# Patient Record
Sex: Female | Born: 1995 | Hispanic: Yes | Marital: Single | Smoking: Never smoker
Health system: Southern US, Community
[De-identification: ages and names within clinical notes are randomized; demographics above are authoritative.]

## PROBLEM LIST (undated history)

## (undated) DIAGNOSIS — Z8759 Personal history of other complications of pregnancy, childbirth and the puerperium: Secondary | ICD-10-CM

## (undated) DIAGNOSIS — K219 Gastro-esophageal reflux disease without esophagitis: Secondary | ICD-10-CM

## (undated) DIAGNOSIS — D649 Anemia, unspecified: Secondary | ICD-10-CM

## (undated) HISTORY — DX: Personal history of other complications of pregnancy, childbirth and the puerperium: Z87.59

## (undated) HISTORY — PX: NO PAST SURGERIES: SHX2092

---
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
## 2015-03-08 NOTE — H&P (Signed)
L&D Evaluation:  History:  HPI 19 year old G1 31P90 with EDC=08/26/2013 by 9 week ultrasound presents at 4039 5/7 weeks with c/o onset contractions at 0300 and worsening this AM. Was seen in office at 1030 and was 3/90% (a change from when seen earlier this AM in L&D). No LOF or VB. [facility], a neg tetra AFP, E coli UTI in early pregnancy treated with Amoxicillin, a normal anatomy scan, and anemia. LABS: O POS, Varicella NI, RI, GBS negative. Will be breast and bottle feeding.   Presents with contractions   Patient's Medical History No Chronic Illness   Patient's Surgical History none   Medications Pre Natal Vitamins  Iron   Allergies NKDA   Social History none   Family History Non-Contributory   ROS:  ROS see HPI   Exam:  Vital Signs stable  115/71   Urine Protein not completed   General no apparent distress   Mental Status clear   Chest clear   Heart normal sinus rhythm, no murmur/gallop/rubs   Abdomen gravid, tender with contractions   Estimated Fetal Weight Average for gestational age, 6#   Fetal Position cephalic/ROP on US   Edema no edema   Reflexes 1+   Pelvic no external lesions, 4-5cm/95%/0 on RN exam   Mebranes Intact   FHT normal rate with no decels, 135 baseline with accels to 150s-170, CAT 1   Fetal Heart Rate 135   Ucx regular, q4 min   Skin dry   Impression:  Impression IUP at 38 5/7 weeks in active labor   Plan:  Plan EFM/NST, monitor contractions and for cervical change, expectant management. Intermittent monitoring if desires to be up or in shower. Stadol if desires for pain.   Electronic Signatures: Trinna BalloonGutierrez, Murice Barbar L (CNM)  (Signed 20-Oct-14 16:22)  Authored: L&D Evaluation   Last Updated: 20-Oct-14 16:22 by Trinna BalloonGutierrez, Marnette Perkins L (CNM)

## 2015-10-09 ENCOUNTER — Encounter: Payer: Self-pay | Admitting: Emergency Medicine

## 2015-10-09 ENCOUNTER — Emergency Department: Payer: Medicaid Other

## 2015-10-09 ENCOUNTER — Emergency Department
Admission: EM | Admit: 2015-10-09 | Discharge: 2015-10-10 | Disposition: A | Discharge status: Home / Self Care | Payer: Medicaid Other | Attending: Emergency Medicine | Admitting: Emergency Medicine

## 2015-10-09 DIAGNOSIS — M549 Dorsalgia, unspecified: Secondary | ICD-10-CM | POA: Insufficient documentation

## 2015-10-09 DIAGNOSIS — R110 Nausea: Secondary | ICD-10-CM | POA: Diagnosis not present

## 2015-10-09 DIAGNOSIS — Z3202 Encounter for pregnancy test, result negative: Secondary | ICD-10-CM | POA: Diagnosis not present

## 2015-10-09 DIAGNOSIS — R1011 Right upper quadrant pain: Secondary | ICD-10-CM

## 2015-10-09 DIAGNOSIS — R1030 Lower abdominal pain, unspecified: Secondary | ICD-10-CM | POA: Diagnosis present

## 2015-10-09 LAB — URINALYSIS COMPLETE WITH MICROSCOPIC (ARMC ONLY)
BACTERIA UA: NONE SEEN
Bilirubin Urine: NEGATIVE
Glucose, UA: NEGATIVE mg/dL
KETONES UR: NEGATIVE mg/dL
Leukocytes, UA: NEGATIVE
NITRITE: NEGATIVE
PH: 6 (ref 5.0–8.0)
PROTEIN: NEGATIVE mg/dL
Specific Gravity, Urine: 1.025 (ref 1.005–1.030)

## 2015-10-09 LAB — CBC WITH DIFFERENTIAL/PLATELET
BASOS ABS: 0.1 10*3/uL (ref 0–0.1)
BASOS PCT: 1 %
Eosinophils Absolute: 0.1 10*3/uL (ref 0–0.7)
Eosinophils Relative: 1 %
HEMATOCRIT: 38.4 % (ref 35.0–47.0)
Hemoglobin: 12.5 g/dL (ref 12.0–16.0)
LYMPHS PCT: 30 %
Lymphs Abs: 3.1 10*3/uL (ref 1.0–3.6)
MCH: 26.6 pg (ref 26.0–34.0)
MCHC: 32.5 g/dL (ref 32.0–36.0)
MCV: 82 fL (ref 80.0–100.0)
Monocytes Absolute: 0.6 10*3/uL (ref 0.2–0.9)
Monocytes Relative: 6 %
NEUTROS ABS: 6.7 10*3/uL — AB (ref 1.4–6.5)
Neutrophils Relative %: 62 %
Platelets: 226 10*3/uL (ref 150–440)
RBC: 4.68 MIL/uL (ref 3.80–5.20)
RDW: 12.7 % (ref 11.5–14.5)
WBC: 10.6 10*3/uL (ref 3.6–11.0)

## 2015-10-09 LAB — COMPREHENSIVE METABOLIC PANEL
ALBUMIN: 4.1 g/dL (ref 3.5–5.0)
ALK PHOS: 120 U/L (ref 38–126)
ALT: 16 U/L (ref 14–54)
ANION GAP: 4 — AB (ref 5–15)
AST: 20 U/L (ref 15–41)
BILIRUBIN TOTAL: 0.2 mg/dL — AB (ref 0.3–1.2)
BUN: 11 mg/dL (ref 6–20)
CALCIUM: 9.1 mg/dL (ref 8.9–10.3)
CO2: 25 mmol/L (ref 22–32)
Chloride: 109 mmol/L (ref 101–111)
Creatinine, Ser: 0.62 mg/dL (ref 0.44–1.00)
Glucose, Bld: 105 mg/dL — ABNORMAL HIGH (ref 65–99)
POTASSIUM: 3.8 mmol/L (ref 3.5–5.1)
Sodium: 138 mmol/L (ref 135–145)
TOTAL PROTEIN: 7.3 g/dL (ref 6.5–8.1)

## 2015-10-09 LAB — PREGNANCY, URINE: PREG TEST UR: NEGATIVE

## 2015-10-09 LAB — LIPASE, BLOOD: LIPASE: 22 U/L (ref 11–51)

## 2015-10-09 NOTE — ED Notes (Addendum)
Pt c/o right lower abd pain and right mid back pain for 2 weeks; seen by her MD yesterday; not prescribed any medications; was told to come to the ED if symptoms worsened; pt says pain is worse; was told she had traces of blood in her urine yesterday; denies frequency or dysuria

## 2015-10-10 ENCOUNTER — Emergency Department: Payer: Medicaid Other

## 2015-10-10 ENCOUNTER — Other Ambulatory Visit: Payer: Self-pay | Admitting: Gastroenterology

## 2015-10-10 DIAGNOSIS — R160 Hepatomegaly, not elsewhere classified: Secondary | ICD-10-CM

## 2015-10-10 MED ORDER — IBUPROFEN 800 MG PO TABS
800.0000 mg | ORAL_TABLET | Freq: Once | ORAL | Status: AC
  Administered 2015-10-10: 800 mg via ORAL
  Filled 2015-10-10: qty 1

## 2015-10-10 NOTE — Discharge Instructions (Signed)

## 2015-10-10 NOTE — ED Provider Notes (Signed)
[facility] Emergency Department Provider Note  ____________________________________________  Time seen: Approximately 2306 PM  I have reviewed the triage vital signs and the nursing notes.   HISTORY  Chief Complaint Back Pain and Abdominal Pain    HPI Rebecca PlattJulisa Woods is a 19 y.o. female who comes into the hospital today with lower abdominal pain. She reports that she's had this pain for 2 weeks. She saw her doctor on Friday and reports that she was told she had a had a kidney infection, something wrong with her gallbladder or something wrong with her appendix. She reports that her doctor told her she had some blood in her urine test. She reports that her doctor did not start her on any medication for her urine infection but did order an ultrasound which is scheduled on the 28th of this month. She reports though that the pain has gotten worse. She reports that it has been bothering her so she decided to come in to be evaluated. The patient denies any fever or vomiting but has had some nausea. Her appetite has been normal and she denies pain with urination. The patient has an IUD so she has not had any menstrual periods and she is sexually active. The patient has never had this pain before and reports her pain as a 7 out of 10 in intensity. She is not taking anything for pain at home. She was given pills by her doctor to help her have a bowel movement but she reports that she doesn't think that is her problem so she did not take any of them. The patient reports that her pain is worse with eating. She is here for evaluation.   History reviewed. No pertinent past medical history.  There are no active problems to display for this patient.   History reviewed. No pertinent past surgical history.  No current outpatient prescriptions on file.  Allergies Review of patient's allergies indicates no known allergies.  History reviewed. No pertinent family history.  Social  History Social History  Substance Use Topics  . Smoking status: Never Smoker   . Smokeless tobacco: None  . Alcohol Use: No    Review of Systems Constitutional: No fever/chills Eyes: No visual changes. ENT: No sore throat. Cardiovascular: Denies chest pain. Respiratory: Denies shortness of breath. Gastrointestinal: abdominal pain.  And nausea, no vomiting.  No diarrhea.  No constipation. Genitourinary: Negative for dysuria. Musculoskeletal: Negative for back pain. Skin: Negative for rash. Neurological: Negative for headaches, focal weakness or numbness.  10-point ROS otherwise negative.  ____________________________________________   PHYSICAL EXAM:  VITAL SIGNS: ED Triage Vitals  Enc Vitals Group     BP 10/09/15 2150 113/79 mmHg     Pulse Rate 10/09/15 2150 87     Resp 10/09/15 2150 18     Temp 10/09/15 2150 98.5 F (36.9 C)     Temp Source 10/09/15 2150 Oral     SpO2 10/09/15 2150 100 %     Weight 10/09/15 2150 161 lb (73.029 kg)     Height 10/09/15 2150 5\' 7"  (1.702 m)     Head Cir --      Peak Flow --      Pain Score 10/09/15 2151 7     Pain Loc --      Pain Edu? --      Excl. in GC? --     Constitutional: Alert and oriented. Well appearing and in mild distress. Eyes: Conjunctivae are normal. PERRL. EOMI. Head: Atraumatic. Nose:  No congestion/rhinnorhea. Mouth/Throat: Mucous membranes are moist.  Oropharynx non-erythematous. Cardiovascular: Normal rate, regular rhythm. Grossly normal heart sounds.  Good peripheral circulation. Respiratory: Normal respiratory effort.  No retractions. Lungs CTAB. Gastrointestinal: Soft with right upper quadrant tenderness to palpation. No distention. Positive bowel sounds Musculoskeletal: No lower extremity tenderness nor edema.   Neurologic:  Normal speech and language.  Skin:  Skin is warm, dry and intact.  Psychiatric: Mood and affect are normal.   ____________________________________________   LABS (all labs ordered  are listed, but only abnormal results are displayed)  Labs Reviewed  CBC WITH DIFFERENTIAL/PLATELET - Abnormal; Notable for the following:    Neutro Abs 6.7 (*)    All other components within normal limits  COMPREHENSIVE METABOLIC PANEL - Abnormal; Notable for the following:    Glucose, Bld 105 (*)    Total Bilirubin 0.2 (*)    Anion gap 4 (*)    All other components within normal limits  URINALYSIS COMPLETEWITH MICROSCOPIC ([facility] ONLY) - Abnormal; Notable for the following:    Color, Urine YELLOW (*)    APPearance HAZY (*)    Hgb urine dipstick 1+ (*)    Squamous Epithelial / LPF 6-30 (*)    All other components within normal limits  LIPASE, BLOOD  PREGNANCY, URINE   ____________________________________________  EKG  None ____________________________________________  RADIOLOGY  KUB: Unremarkable bowel gas pattern, no free intra-abdominal air seen [place] abd: Unremarkable ultrasound of the right upper quadrant ____________________________________________   PROCEDURES  Procedure(s) performed: None  Critical Care performed: No  ____________________________________________   INITIAL IMPRESSION / ASSESSMENT AND PLAN / ED COURSE  Pertinent labs & imaging results that were available during my care of the patient were reviewed by me and considered in my medical decision making (see chart for details).  This is a 19 year old female who comes into the hospital today with abdominal pain that she's had for 2 weeks. Although the patient saw her physician and has an ultrasound scheduled she reports that she is unable to tolerate the pain so she came in for evaluation.  The patient did receive a dose of Profen and she reports that she feels improved. The patient's blood counts are unremarkable she does not have an elevated white blood cell count nor does she have any infection present in her urine. I will have the patient follow back up with her primary care physician for further  evaluation of this two-week long pain. I informed the patient that she can try ibuprofen and Tylenol for the symptoms as well. The patient expresses agreement with the plan will be discharged to follow-up with her primary care physician. ____________________________________________   FINAL CLINICAL IMPRESSION(S) / ED DIAGNOSES  Final diagnoses:  RUQ abdominal pain      Rebecka Apley, MD 10/10/15 [phone]

## 2015-10-30 HISTORY — PX: OVARIAN CYST SURGERY: SHX726

## 2016-05-17 ENCOUNTER — Other Ambulatory Visit: Payer: Medicaid Other

## 2016-05-17 ENCOUNTER — Encounter: Payer: Self-pay | Admitting: *Deleted

## 2016-05-17 NOTE — Patient Instructions (Signed)
  Your procedure is scheduled on: 05-24-16 (THURSDAY) Report to Same Day Surgery 2nd floor medical mall To find out your arrival time please call [phone] between 1PM - 3PM on 05-23-16 (WEDNESDAY)  Remember: Instructions that are not followed completely may result in serious medical risk, up to and including death, or upon the discretion of your surgeon and anesthesiologist your surgery may need to be rescheduled.    _x___ 1. Do not eat food or drink liquids after midnight. No gum chewing or hard candies.     __x__ 2. No Alcohol for 24 hours before or after surgery.   __x__3. No Smoking for 24 prior to surgery.   ____  4. Bring all medications with you on the day of surgery if instructed.    __x__ 5. Notify your doctor if there is any change in your medical condition     (cold, fever, infections).     Do not wear jewelry, make-up, hairpins, clips or nail polish.  Do not wear lotions, powders, or perfumes. You may wear deodorant.  Do not shave 48 hours prior to surgery. Men may shave face and neck.  Do not bring valuables to the hospital.    [facility] is not responsible for any belongings or valuables.               Contacts, dentures or bridgework may not be worn into surgery.  Leave your suitcase in the car. After surgery it may be brought to your room.  For patients admitted to the hospital, discharge time is determined by your treatment team.   Patients discharged the day of surgery will not be allowed to drive home.    Please read over the following fact sheets that you were given:   [facility] Preparing for Surgery and or MRSA Information   ____ Take these medicines the morning of surgery with A SIP OF WATER:    1. NONE  2.  3.  4.  5.  6.  ____ Fleet Enema (as directed)   _x___ Use CHG Soap or sage wipes as directed on instruction sheet   ____ Use inhalers on the day of surgery and bring to hospital day of surgery  ____ Stop metformin 2 days prior to  surgery    ____ Take 1/2 of usual insulin dose the night before surgery and none on the morning of surgery.   ____ Stop aspirin or coumadin, or plavix  _x__ Stop Anti-inflammatories such as Advil, Aleve, Ibuprofen, Motrin, Naproxen,          Naprosyn, Goodies powders or aspirin products. Ok to take Tylenol.   ____ Stop supplements until after surgery.    ____ Bring C-Pap to the hospital.  

## 2016-05-18 ENCOUNTER — Encounter
Admission: RE | Admit: 2016-05-18 | Discharge: 2016-05-18 | Disposition: A | Discharge status: Home / Self Care | Payer: Medicaid Other | Source: Ambulatory Visit | Attending: Obstetrics & Gynecology | Admitting: Obstetrics & Gynecology

## 2016-05-18 DIAGNOSIS — Z01812 Encounter for preprocedural laboratory examination: Secondary | ICD-10-CM | POA: Diagnosis present

## 2016-05-18 LAB — CBC
HCT: 37.9 % (ref 35.0–47.0)
HEMOGLOBIN: 12.9 g/dL (ref 12.0–16.0)
MCH: 28.1 pg (ref 26.0–34.0)
MCHC: 34 g/dL (ref 32.0–36.0)
MCV: 82.6 fL (ref 80.0–100.0)
Platelets: 202 10*3/uL (ref 150–440)
RBC: 4.59 MIL/uL (ref 3.80–5.20)
RDW: 13 % (ref 11.5–14.5)
WBC: 5.3 10*3/uL (ref 3.6–11.0)

## 2016-05-18 LAB — TYPE AND SCREEN
ABO/RH(D): O POS
Antibody Screen: NEGATIVE

## 2016-05-24 ENCOUNTER — Ambulatory Visit
Admission: RE | Admit: 2016-05-24 | Discharge: 2016-05-24 | Disposition: A | Discharge status: Home / Self Care | Payer: Medicaid Other | Source: Ambulatory Visit | Attending: Obstetrics & Gynecology | Admitting: Obstetrics & Gynecology

## 2016-05-24 ENCOUNTER — Encounter: Payer: Self-pay | Admitting: *Deleted

## 2016-05-24 ENCOUNTER — Encounter
Admission: RE | Disposition: A | Discharge status: Home / Self Care | Payer: Self-pay | Source: Ambulatory Visit | Attending: Obstetrics & Gynecology

## 2016-05-24 ENCOUNTER — Ambulatory Visit: Payer: Medicaid Other | Admitting: Anesthesiology

## 2016-05-24 DIAGNOSIS — N9419 Other specified dyspareunia: Secondary | ICD-10-CM

## 2016-05-24 DIAGNOSIS — G8929 Other chronic pain: Secondary | ICD-10-CM | POA: Insufficient documentation

## 2016-05-24 DIAGNOSIS — R102 Pelvic and perineal pain: Secondary | ICD-10-CM | POA: Insufficient documentation

## 2016-05-24 DIAGNOSIS — N83202 Unspecified ovarian cyst, left side: Secondary | ICD-10-CM | POA: Diagnosis present

## 2016-05-24 DIAGNOSIS — N941 Unspecified dyspareunia: Secondary | ICD-10-CM | POA: Diagnosis present

## 2016-05-24 DIAGNOSIS — Z800 Family history of malignant neoplasm of digestive organs: Secondary | ICD-10-CM | POA: Diagnosis not present

## 2016-05-24 DIAGNOSIS — Z975 Presence of (intrauterine) contraceptive device: Secondary | ICD-10-CM | POA: Diagnosis not present

## 2016-05-24 HISTORY — DX: Other specified dyspareunia: N94.19

## 2016-05-24 HISTORY — DX: Anemia, unspecified: D64.9

## 2016-05-24 HISTORY — PX: LAPAROSCOPY: SHX197

## 2016-05-24 HISTORY — DX: Gastro-esophageal reflux disease without esophagitis: K21.9

## 2016-05-24 LAB — URINE DRUG SCREEN, QUALITATIVE (ARMC ONLY)
AMPHETAMINES, UR SCREEN: NOT DETECTED
BARBITURATES, UR SCREEN: NOT DETECTED
BENZODIAZEPINE, UR SCRN: NOT DETECTED
CANNABINOID 50 NG, UR ~~LOC~~: POSITIVE — AB
COCAINE METABOLITE, UR ~~LOC~~: NOT DETECTED
MDMA (Ecstasy)Ur Screen: NOT DETECTED
METHADONE SCREEN, URINE: NOT DETECTED
Opiate, Ur Screen: NOT DETECTED
Phencyclidine (PCP) Ur S: NOT DETECTED
Tricyclic, Ur Screen: NOT DETECTED

## 2016-05-24 LAB — POCT PREGNANCY, URINE: PREG TEST UR: NEGATIVE

## 2016-05-24 SURGERY — LAPAROSCOPY, DIAGNOSTIC
Anesthesia: General | Site: Abdomen | Wound class: Clean Contaminated

## 2016-05-24 MED ORDER — ROCURONIUM BROMIDE 100 MG/10ML IV SOLN
INTRAVENOUS | Status: DC | PRN
  Administered 2016-05-24: 40 mg via INTRAVENOUS

## 2016-05-24 MED ORDER — PROPOFOL 10 MG/ML IV BOLUS
INTRAVENOUS | Status: DC | PRN
  Administered 2016-05-24: 150 mg via INTRAVENOUS

## 2016-05-24 MED ORDER — FENTANYL CITRATE (PF) 100 MCG/2ML IJ SOLN
25.0000 ug | INTRAMUSCULAR | Status: DC | PRN
  Administered 2016-05-24 (×2): 50 ug via INTRAVENOUS

## 2016-05-24 MED ORDER — OXYCODONE HCL 5 MG PO TABS
5.0000 mg | ORAL_TABLET | Freq: Once | ORAL | Status: AC | PRN
  Administered 2016-05-24: 5 mg via ORAL

## 2016-05-24 MED ORDER — OXYCODONE-ACETAMINOPHEN 5-325 MG PO TABS: 1.0000 | ORAL_TABLET | ORAL | 0 refills | Status: DC | PRN

## 2016-05-24 MED ORDER — OXYCODONE HCL 5 MG/5ML PO SOLN: 5.0000 mg | Freq: Once | ORAL | Status: AC | PRN

## 2016-05-24 MED ORDER — OXYCODONE HCL 5 MG PO TABS
ORAL_TABLET | ORAL | Status: AC
  Filled 2016-05-24: qty 1

## 2016-05-24 MED ORDER — FENTANYL CITRATE (PF) 100 MCG/2ML IJ SOLN
INTRAMUSCULAR | Status: DC | PRN
  Administered 2016-05-24 (×2): 50 ug via INTRAVENOUS
  Administered 2016-05-24: 100 ug via INTRAVENOUS

## 2016-05-24 MED ORDER — DEXAMETHASONE SODIUM PHOSPHATE 10 MG/ML IJ SOLN
INTRAMUSCULAR | Status: DC | PRN
  Administered 2016-05-24: 10 mg via INTRAVENOUS

## 2016-05-24 MED ORDER — LACTATED RINGERS IV SOLN
INTRAVENOUS | Status: DC
  Administered 2016-05-24 (×2): via INTRAVENOUS

## 2016-05-24 MED ORDER — BUPIVACAINE HCL (PF) 0.5 % IJ SOLN
INTRAMUSCULAR | Status: DC | PRN
  Administered 2016-05-24: 12 mL

## 2016-05-24 MED ORDER — FAMOTIDINE 20 MG PO TABS
20.0000 mg | ORAL_TABLET | Freq: Once | ORAL | Status: AC
  Administered 2016-05-24: 20 mg via ORAL

## 2016-05-24 MED ORDER — GLYCOPYRROLATE 0.2 MG/ML IJ SOLN
INTRAMUSCULAR | Status: DC | PRN
  Administered 2016-05-24: 0.6 mg via INTRAVENOUS

## 2016-05-24 MED ORDER — FENTANYL CITRATE (PF) 100 MCG/2ML IJ SOLN
INTRAMUSCULAR | Status: AC
  Administered 2016-05-24: 50 ug via INTRAVENOUS
  Filled 2016-05-24: qty 2

## 2016-05-24 MED ORDER — LIDOCAINE HCL (CARDIAC) 20 MG/ML IV SOLN
INTRAVENOUS | Status: DC | PRN
  Administered 2016-05-24: 30 mg via INTRAVENOUS

## 2016-05-24 MED ORDER — FAMOTIDINE 20 MG PO TABS
ORAL_TABLET | ORAL | Status: AC
  Administered 2016-05-24: 20 mg via ORAL
  Filled 2016-05-24: qty 1

## 2016-05-24 MED ORDER — MIDAZOLAM HCL 2 MG/2ML IJ SOLN
INTRAMUSCULAR | Status: DC | PRN
  Administered 2016-05-24: 2 mg via INTRAVENOUS

## 2016-05-24 MED ORDER — ONDANSETRON HCL 4 MG/2ML IJ SOLN
INTRAMUSCULAR | Status: DC | PRN
  Administered 2016-05-24: 4 mg via INTRAVENOUS

## 2016-05-24 MED ORDER — BUPIVACAINE HCL (PF) 0.5 % IJ SOLN
INTRAMUSCULAR | Status: AC
  Filled 2016-05-24: qty 30

## 2016-05-24 MED ORDER — NEOSTIGMINE METHYLSULFATE 10 MG/10ML IV SOLN
INTRAVENOUS | Status: DC | PRN
  Administered 2016-05-24: 4 mg via INTRAVENOUS

## 2016-05-24 SURGICAL SUPPLY — 35 items
BLADE SURG SZ11 CARB STEEL (BLADE) ×5 IMPLANT
CANISTER SUCT 1200ML W/VALVE (MISCELLANEOUS) ×5 IMPLANT
CATH ROBINSON RED A/P 16FR (CATHETERS) ×5 IMPLANT
CHLORAPREP W/TINT 26ML (MISCELLANEOUS) ×5 IMPLANT
DRESSING TELFA 4X3 1S ST N-ADH (GAUZE/BANDAGES/DRESSINGS) IMPLANT
ENDOPOUCH RETRIEVER 10 (MISCELLANEOUS) IMPLANT
GAUZE SPONGE NON-WVN 2X2 STRL (MISCELLANEOUS) IMPLANT
GLOVE BIO SURGEON STRL SZ8 (GLOVE) ×10 IMPLANT
GLOVE INDICATOR 8.0 STRL GRN (GLOVE) ×5 IMPLANT
GOWN STRL REUS W/ TWL LRG LVL3 (GOWN DISPOSABLE) ×6 IMPLANT
GOWN STRL REUS W/ TWL XL LVL3 (GOWN DISPOSABLE) ×3 IMPLANT
GOWN STRL REUS W/TWL LRG LVL3 (GOWN DISPOSABLE) ×4
GOWN STRL REUS W/TWL XL LVL3 (GOWN DISPOSABLE) ×2
IRRIGATION STRYKERFLOW (MISCELLANEOUS) IMPLANT
IRRIGATOR STRYKERFLOW (MISCELLANEOUS)
IV LACTATED RINGERS 1000ML (IV SOLUTION) IMPLANT
KIT PINK PAD W/HEAD ARE REST (MISCELLANEOUS) ×5
KIT PINK PAD W/HEAD ARM REST (MISCELLANEOUS) ×3 IMPLANT
LABEL OR SOLS (LABEL) ×5 IMPLANT
LIQUID BAND (GAUZE/BANDAGES/DRESSINGS) ×5 IMPLANT
NEEDLE VERESS 14GA 120MM (NEEDLE) ×5 IMPLANT
NS IRRIG 500ML POUR BTL (IV SOLUTION) ×5 IMPLANT
PACK GYN LAPAROSCOPIC (MISCELLANEOUS) ×5 IMPLANT
PAD PREP 24X41 OB/GYN DISP (PERSONAL CARE ITEMS) ×5 IMPLANT
SCISSORS METZENBAUM CVD 33 (INSTRUMENTS) ×5 IMPLANT
SHEARS HARMONIC ACE PLUS 36CM (ENDOMECHANICALS) IMPLANT
SLEEVE ENDOPATH XCEL 5M (ENDOMECHANICALS) IMPLANT
SPONGE VERSALON 2X2 STRL (MISCELLANEOUS)
STRAP SAFETY BODY (MISCELLANEOUS) ×5 IMPLANT
SUT VIC AB 2-0 UR6 27 (SUTURE) IMPLANT
SUT VIC AB 4-0 PS2 18 (SUTURE) IMPLANT
SYRINGE 10CC LL (SYRINGE) ×5 IMPLANT
TROCAR ENDO BLADELESS 11MM (ENDOMECHANICALS) IMPLANT
TROCAR XCEL NON-BLD 5MMX100MML (ENDOMECHANICALS) ×5 IMPLANT
TUBING INSUFFLATOR HI FLOW (MISCELLANEOUS) ×5 IMPLANT

## 2016-05-24 NOTE — Anesthesia Preprocedure Evaluation (Signed)
Anesthesia Evaluation  Patient identified by MRN, date of birth, ID band Patient awake    Reviewed: Allergy & Precautions, NPO status , Patient's Chart, lab work & pertinent test results  History of Anesthesia Complications Negative for: history of anesthetic complications  Airway Mallampati: I  TM Distance: >3 FB Neck ROM: Full    Dental no notable dental hx.    Pulmonary neg COPD,    breath sounds clear to auscultation- rhonchi (-) wheezing      Cardiovascular Exercise Tolerance: Good (-) hypertension(-) CAD  Rhythm:Regular Rate:Normal - Systolic murmurs and - Diastolic murmurs    Neuro/Psych negative neurological ROS  negative psych ROS   GI/Hepatic Neg liver ROS, GERD  ,  Endo/Other  negative endocrine ROSneg diabetes  Renal/GU negative Renal ROS     Musculoskeletal negative musculoskeletal ROS (+)   Abdominal (+) - obese,   Peds  Hematology  (+) anemia ,   Anesthesia Other Findings   Reproductive/Obstetrics                             Anesthesia Physical Anesthesia Plan  ASA: I  Anesthesia Plan: General   Post-op Pain Management:    Induction: Intravenous  Airway Management Planned: Oral ETT  Additional Equipment:   Intra-op Plan:   Post-operative Plan: Extubation in OR  Informed Consent: I have reviewed the patients History and Physical, chart, labs and discussed the procedure including the risks, benefits and alternatives for the proposed anesthesia with the patient or authorized representative who has indicated his/her understanding and acceptance.   Dental advisory given  Plan Discussed with: Anesthesiologist and CRNA  Anesthesia Plan Comments:         Anesthesia Quick Evaluation

## 2016-05-24 NOTE — H&P (Signed)
History and Physical Interval Note:  05/24/2016 2:56 PM  Rebecca Woods  has presented today for surgery, with the diagnosis of chronic pelvic pain,ovarian cyst-left  The various methods of treatment have been discussed with the patient and family. After consideration of risks, benefits and other options for treatment, the patient has consented to  Procedure(s): LAPAROSCOPY OPERATIVE/ EXCISION OF ENDOMETRIOSIS (N/A) LAPAROSCOPIC OVARIAN CYSTECTOMY (Left) as a surgical intervention .  The patient's history has been reviewed, patient examined, no change in status, stable for surgery.  Pt has the following beta blocker history-  Not taking Beta Blocker.  I have reviewed the patient's chart and labs.  Questions were answered to the patient's satisfaction.       Letitia Libra

## 2016-05-24 NOTE — Transfer of Care (Signed)
Immediate Anesthesia Transfer of Care Note  Patient: Rebecca Woods  Procedure(s) Performed: Procedure(s): LAPAROSCOPY DIAGNOSTIC (N/A)  Patient Location: PACU  Anesthesia Type:General  Level of Consciousness: patient cooperative and lethargic  Airway & Oxygen Therapy: Patient Spontanous Breathing and Patient connected to face mask oxygen  Post-op Assessment: Report given to RN and Post -op Vital signs reviewed and stable  Post vital signs: Reviewed and stable  Last Vitals:  Vitals:   05/24/16 1241 05/24/16 1617  BP: 111/66 120/71  Pulse: 83   Resp: 16 20  Temp: 36.8 C 36.2 C    Last Pain:  Vitals:   05/24/16 1617  TempSrc: Tympanic         Complications: No apparent anesthesia complications

## 2016-05-24 NOTE — Discharge Instructions (Addendum)
AMBULATORY SURGERY  °DISCHARGE INSTRUCTIONS ° ° °1) The drugs that you were given will stay in your system until tomorrow so for the next 24 hours you should not: ° °A) Drive an automobile °B) Make any legal decisions °C) Drink any alcoholic beverage ° ° °2) You may resume regular meals tomorrow.  Today it is better to start with liquids and gradually work up to solid foods. ° °You may eat anything you prefer, but it is better to start with liquids, then soup and crackers, and gradually work up to solid foods. ° ° °3) Please notify your doctor immediately if you have any unusual bleeding, trouble breathing, redness and pain at the surgery site, drainage, fever, or pain not relieved by medication. ° ° ° °4) Additional Instructions: ° ° ° ° ° ° ° °Please contact your physician with any problems or Same Day Surgery at [phone], Monday through Friday 6 am to 4 pm, or [place] at  Main number at [phone].General Gynecological Post-Operative Instructions °You may expect to feel dizzy, weak, and drowsy for as long as 24 hours after receiving the medicine that made you sleep (anesthetic).  °Do not drive a car, ride a bicycle, participate in physical activities, or take public transportation until you are done taking narcotic pain medicines or as directed by your doctor.  °Do not drink alcohol or take tranquilizers.  °Do not take medicine that has not been prescribed by your doctor.  °Do not sign important papers or make important decisions while on narcotic pain medicines.  °Have a responsible person with you.  °CARE OF INCISION  °Keep incision clean and dry. °Take showers instead of baths until your doctor gives you permission to take baths.  °Avoid heavy lifting (more than 10 pounds/4.5 kilograms), pushing, or pulling.  °Avoid activities that may risk injury to your surgical site.  °No sexual intercourse or placement of anything in the vagina for 1 weeks or as instructed by your doctor. °If you have  tubes coming from the wound site, check with your doctor regarding appropriate care of the tubes. °Only take prescription or over-the-counter medicines  for pain, discomfort, or fever as directed by your doctor. Do not take aspirin. It can make you bleed. Take medicines (antibiotics) that kill germs if they are prescribed for you.  °Call the office or go to the ER if:  °You feel sick to your stomach (nauseous) and you start to throw up (vomit).  °You have trouble eating or drinking.  °You have an oral temperature above 101.  °You have constipation that is not helped by adjusting diet or increasing fluid intake. Pain medicines are a common cause of constipation.  °You have any other concerns. °SEEK IMMEDIATE MEDICAL CARE IF:  °You have persistent dizziness.  °You have difficulty breathing or a congested sounding (croupy) cough.  °You have an oral temperature above 102.5, not controlled by medicine.  °There is increasing pain or tenderness near or in the surgical site.  ° ° ° °

## 2016-05-24 NOTE — Anesthesia Postprocedure Evaluation (Signed)
Anesthesia Post Note  Patient: Sayaka Trew  Procedure(s) Performed: Procedure(s) (LRB): LAPAROSCOPY DIAGNOSTIC (N/A)  Patient location during evaluation: PACU Anesthesia Type: General Level of consciousness: awake and alert Pain management: pain level controlled Vital Signs Assessment: post-procedure vital signs reviewed and stable Respiratory status: spontaneous breathing, nonlabored ventilation, respiratory function stable and patient connected to nasal cannula oxygen Cardiovascular status: blood pressure returned to baseline and stable Postop Assessment: no signs of nausea or vomiting Anesthetic complications: no    Last Vitals:  Vitals:   05/24/16 1656 05/24/16 1708  BP: 104/65 126/73  Pulse: 82 (!) 117  Resp: 16 16  Temp: 36.7 C (!) 35.9 C    Last Pain:  Vitals:   05/24/16 1708  TempSrc: Tympanic  PainSc: 4                  Jomarie Longs K Piscitello

## 2016-05-24 NOTE — Anesthesia Procedure Notes (Signed)
Procedure Name: Intubation Date/Time: 05/24/2016 3:26 PM Performed by: Omer Jack Pre-anesthesia Checklist: Patient identified, Patient being monitored, Timeout performed, Emergency Drugs available and Suction available Patient Re-evaluated:Patient Re-evaluated prior to inductionOxygen Delivery Method: Circle system utilized Preoxygenation: Pre-oxygenation with 100% oxygen Intubation Type: IV induction and Rapid sequence Ventilation: Mask ventilation without difficulty Laryngoscope Size: Miller and 2 Grade View: Grade I Tube type: Oral Tube size: 7.0 mm Number of attempts: 1 Airway Equipment and Method: Stylet Placement Confirmation: ETT inserted through vocal cords under direct vision,  positive ETCO2 and breath sounds checked- equal and bilateral Secured at: 21 cm Tube secured with: Tape Dental Injury: Teeth and Oropharynx as per pre-operative assessment

## 2016-05-24 NOTE — Op Note (Signed)
  Operative Note   05/24/2016  PRE-OP DIAGNOSIS: Pelvic pain and Dyspareunia    POST-OP DIAGNOSIS: same   PROCEDURE: Procedure(s): LAPAROSCOPY DIAGNOSTIC   SURGEON: Annamarie Major, MD, FACOG  ANESTHESIA: * No anesthesia type entered *   ESTIMATED BLOOD LOSS: Min  COMPLICATIONS: None  DISPOSITION: PACU - hemodynamically stable.  CONDITION: stable  FINDINGS: Laparoscopic survey of the abdomen revealed a grossly normal uterus, tubes, ovaries, liver edge, gallbladder edge and appendix, No intra-abdominal adhesions were noted.   PROCEDURE IN DETAIL: The patient was taken to the OR where anesthesia was administed. The patient was positioned in dorsal lithotomy in the [place] stirrups. The patient was then examined under anesthesia with the above noted findings. The patient was prepped and draped in the normal sterile fashion and foley catheter was placed. A Graves speculum was placed in the vagina and a sponge stick was placed for manipulation purposes.  IUD visualized (strings). The speculum was then removed.  Attention was turned to the patient's abdomen where a 5 mm skin incision was made in the umbilical fold, after injection of local anesthesia. The Veress step needle was carefully introduced into the peritoneal cavity with placement confirmed using the hanging drop technique.  Pneumoperitoneum was obtained. The 5 mm port was then placed under direct visualization with the operative laparoscope.  Trendelenburg positioning.  Additional 5 mm trocar was then placed in the RLQ lateral to the inferior epigastric blood vessels under direct visualization with the laparoscope.  Instrumentation to visualize complete pelvic anatomy performed.    No evidence for ovarian cysts or endometriosis or pelvic congestion or adhesions or PID noted.  Instruments and trocars removed, gas expelled, and skin closed with skin adhesive glue.  Instrument, needle, and sponge counts correct x2 at the conclusion of the  case.  Pt goes to recovery room in stable condition.

## 2016-05-24 NOTE — Progress Notes (Signed)
Pt states feeling somewhat better after pain pill

## 2016-05-25 ENCOUNTER — Encounter: Payer: Self-pay | Admitting: Obstetrics & Gynecology

## 2017-04-16 ENCOUNTER — Emergency Department: Payer: Medicaid Other

## 2017-04-16 ENCOUNTER — Emergency Department
Admission: EM | Admit: 2017-04-16 | Discharge: 2017-04-16 | Disposition: A | Discharge status: Home / Self Care | Payer: Medicaid Other | Attending: Emergency Medicine | Admitting: Emergency Medicine

## 2017-04-16 ENCOUNTER — Encounter: Payer: Self-pay | Admitting: Emergency Medicine

## 2017-04-16 DIAGNOSIS — R55 Syncope and collapse: Secondary | ICD-10-CM

## 2017-04-16 DIAGNOSIS — S098XXA Other specified injuries of head, initial encounter: Secondary | ICD-10-CM | POA: Diagnosis present

## 2017-04-16 DIAGNOSIS — S060X9A Concussion with loss of consciousness of unspecified duration, initial encounter: Secondary | ICD-10-CM

## 2017-04-16 DIAGNOSIS — W1839XA Other fall on same level, initial encounter: Secondary | ICD-10-CM | POA: Diagnosis not present

## 2017-04-16 DIAGNOSIS — Y999 Unspecified external cause status: Secondary | ICD-10-CM | POA: Diagnosis not present

## 2017-04-16 DIAGNOSIS — Y939 Activity, unspecified: Secondary | ICD-10-CM | POA: Diagnosis not present

## 2017-04-16 DIAGNOSIS — Y929 Unspecified place or not applicable: Secondary | ICD-10-CM | POA: Diagnosis not present

## 2017-04-16 LAB — CBC
HEMATOCRIT: 38.6 % (ref 35.0–47.0)
HEMOGLOBIN: 12.7 g/dL (ref 12.0–16.0)
MCH: 27.2 pg (ref 26.0–34.0)
MCHC: 32.9 g/dL (ref 32.0–36.0)
MCV: 82.5 fL (ref 80.0–100.0)
Platelets: 305 10*3/uL (ref 150–440)
RBC: 4.69 MIL/uL (ref 3.80–5.20)
RDW: 12.8 % (ref 11.5–14.5)
WBC: 12.9 10*3/uL — ABNORMAL HIGH (ref 3.6–11.0)

## 2017-04-16 LAB — BASIC METABOLIC PANEL
Anion gap: 6 (ref 5–15)
BUN: 9 mg/dL (ref 6–20)
CALCIUM: 8.9 mg/dL (ref 8.9–10.3)
CHLORIDE: 104 mmol/L (ref 101–111)
CO2: 27 mmol/L (ref 22–32)
CREATININE: 0.68 mg/dL (ref 0.44–1.00)
GFR calc Af Amer: >60 mL/min (ref >60)
GFR calc non Af Amer: >60 mL/min (ref >60)
Glucose, Bld: 80 mg/dL (ref 65–99)
Potassium: 3.9 mmol/L (ref 3.5–5.1)
SODIUM: 137 mmol/L (ref 135–145)

## 2017-04-16 LAB — POCT PREGNANCY, URINE: Preg Test, Ur: NEGATIVE

## 2017-04-16 NOTE — ED Triage Notes (Signed)
Patient states that she passed out and hit her head on Saturday. Patient states that she has had a headache since then. Patient denies taking any anticoagulants.

## 2017-04-16 NOTE — ED Provider Notes (Signed)
[facility] Emergency Department Provider Note  Time seen: 8:45 PM  I have reviewed the triage vital signs and the nursing notes.   HISTORY  Chief Complaint Headache and Head Injury    HPI Aiyah Scarpelli is a 21 y.o. female with a past medical history of anemia, presents to the emergency department for continued headache. According to the patient 3 days ago she had a syncopal event causing her to fall to the ground hitting her head. She states since that fall she has had a continued headache, somewhat worse today she also has a feeling of generalized fatigue/weakness. Denies any focal deficits. Large C negative review of systems otherwise. Describes her headache as moderate dull and global.  Past Medical History:  Diagnosis Date  . Anemia    H/O  . GERD (gastroesophageal reflux disease)    NO MEDS    Patient Active Problem List   Diagnosis Date Noted  . Dyspareunia due to medical condition in female 05/24/2016    Past Surgical History:  Procedure Laterality Date  . LAPAROSCOPY N/A 05/24/2016   Procedure: LAPAROSCOPY DIAGNOSTIC;  Surgeon: Nadara Mustard, MD;  Location: [facility] ORS;  Service: Gynecology;  Laterality: N/A;  . NO PAST SURGERIES      Prior to Admission medications   Medication Sig Start Date End Date Taking? Authorizing Provider  levonorgestrel (MIRENA) 20 MCG/24HR IUD 1 each by Intrauterine route once.    [provider]  oxyCODONE-acetaminophen (PERCOCET) 5-325 MG tablet Take 1 tablet by mouth every 4 (four) hours as needed for moderate pain or severe pain. 05/24/16   Nadara Mustard, MD    No Known Allergies  No family history on file.  Social History Social History  Substance Use Topics  . Smoking status: Never Smoker  . Smokeless tobacco: Former Neurosurgeon  . Alcohol use Yes    Review of Systems Constitutional: Negative for fever Cardiovascular: Negative for chest pain. Respiratory: Negative for shortness of  breath. Gastrointestinal: Negative for abdominal pain Musculoskeletal: Negative for back pain. Neurological: Moderate headache. Denies focal weakness or numbness All other ROS negative  ____________________________________________   PHYSICAL EXAM:  VITAL SIGNS: ED Triage Vitals  Enc Vitals Group     BP 04/16/17 1937 111/71     Pulse Rate 04/16/17 1937 97     Resp 04/16/17 1937 16     Temp 04/16/17 1937 98.7 F (37.1 C)     Temp Source 04/16/17 1937 Oral     SpO2 04/16/17 1937 100 %     Weight 04/16/17 1938 150 lb (68 kg)     Height 04/16/17 1938 5\' 6"  (1.676 m)     Head Circumference --      Peak Flow --      Pain Score 04/16/17 1941 8     Pain Loc --      Pain Edu? --      Excl. in GC? --     Constitutional: Alert and oriented. Well appearing and in no distress. Eyes: Normal exam, PERRL ENT   Head: Normocephalic and atraumatic   Mouth/Throat: Mucous membranes are moist. Cardiovascular: Normal rate, regular rhythm. No murmur Respiratory: Normal respiratory effort without tachypnea nor retractions. Breath sounds are clear Gastrointestinal: Soft and nontender. No distention.   Musculoskeletal: Nontender with normal range of motion in all extremities.  Neurologic:  Normal speech and language. No gross focal neurologic deficits. 5/5 motor. Equal grip strengths. No pronator drift. No cranial nerve deficit.  Skin:  Skin  is warm, dry and intact.  Psychiatric: Mood and affect are normal.   ____________________________________________   RADIOLOGY  CT negative  ____________________________________________   INITIAL IMPRESSION / ASSESSMENT AND PLAN / ED COURSE  Pertinent labs & imaging results that were available during my care of the patient were reviewed by me and considered in my medical decision making (see chart for details).  The patient presents the emergency department with a continued headache general fatigue/dizziness since a syncopal event head injury  3 days ago. We will obtain a CT scan of the head and lab work. Patient agreeable to this plan.  CT scan is negative. Labs are normal. Patient is not pregnant. Suspect likely a concussive type symptoms. We'll discharge home with supportive care. Patient agreeable to plan.  ____________________________________________   FINAL CLINICAL IMPRESSION(S) / ED DIAGNOSES  Syncope Head injury    Minna AntisPaduchowski, Stephan Nelis, MD 04/16/17 2202

## 2017-07-30 ENCOUNTER — Ambulatory Visit: Payer: Self-pay | Admitting: Obstetrics and Gynecology

## 2017-08-07 ENCOUNTER — Telehealth: Payer: Self-pay | Admitting: Obstetrics and Gynecology

## 2017-08-07 NOTE — Telephone Encounter (Signed)
[facility] medical referring for removal of IUD. Called and spoke with Patient about canceled appt on 07/30/17 calling to r/s. Pt will call back to r/s

## 2017-08-12 NOTE — Telephone Encounter (Signed)
Attempted to reach patient phone number has been disconnected

## 2017-08-22 NOTE — Telephone Encounter (Signed)
Called and lvm for pt to call back to be schedule °

## 2018-02-05 ENCOUNTER — Encounter: Payer: Self-pay | Admitting: Obstetrics and Gynecology

## 2018-02-05 ENCOUNTER — Ambulatory Visit (INDEPENDENT_AMBULATORY_CARE_PROVIDER_SITE_OTHER): Payer: Medicaid Other | Admitting: Obstetrics and Gynecology

## 2018-02-05 VITALS — BP 104/64 | HR 81 | Ht 67.0 in | Wt 183.0 lb

## 2018-02-05 DIAGNOSIS — N898 Other specified noninflammatory disorders of vagina: Secondary | ICD-10-CM

## 2018-02-05 DIAGNOSIS — Z30432 Encounter for removal of intrauterine contraceptive device: Secondary | ICD-10-CM

## 2018-02-05 NOTE — Progress Notes (Signed)
   Chief Complaint  Patient presents with  . Contraception    IUD removal      History of Present Illness:  Rebecca Woods is a 22 y.o. that had a Mirena IUD placed approximately 4 years ago. Since that time, she denies dyspareunia, pelvic pain, non-menstrual bleeding, vaginal d/c, heavy bleeding. [facility] Referred by PCP Hermenia Fiscal(Boscia, Kathlynn GrateHeather E, NP).   Pt also noted a lump vaginally a few months ago and it is bothersome to her. No urin sx. Notices it with wiping.   BP 104/64   Pulse 81   Ht 5\' 7"  (1.702 m)   Wt 183 lb (83 kg)   BMI 28.66 kg/m   Pelvic exam:   Physical Exam  Constitutional: She appears well-developed.  Genitourinary: There is no rash, tenderness, lesion or Bartholin's cyst on the right labia. There is no rash, tenderness, lesion or Bartholin's cyst on the left labia.    No erythema, tenderness or bleeding in the vagina. No vaginal discharge found. Right adnexum does not display mass and does not display tenderness. Left adnexum does not display mass and does not display tenderness. Cervix does not exhibit motion tenderness, discharge or friability. Uterus is not enlarged or tender.  Genitourinary Comments: ~1 CM FIRM CYST RT VAGINAL CANAL, PARTIALLY UNDER URETHRA; NT TO PALPATE  Nursing note and vitals reviewed.   IUD Removal Strings of IUD identified and grasped.  IUD removed without problem with ring forceps.  Pt tolerated this well.  IUD noted to be intact.  Assessment:  Encounter for removal of intrauterine contraceptive device (IUD)  Vaginal cyst - Anterior, encroaching on urethra. Refer to MD for surg exc consultation. May need to be seen by specialist given location.     Plan: IUD removed and plan for contraception is no method. She was amenable to this plan.  Alicia B. Copland, PA-C 02/05/2018 4:56 PM

## 2018-02-05 NOTE — Patient Instructions (Signed)
I value your feedback and entrusting us with your care. If you get a [facility] patient survey, I would appreciate you taking the time to let us know about your experience today. Thank you! 

## 2018-02-10 ENCOUNTER — Encounter: Payer: Self-pay | Admitting: Obstetrics and Gynecology

## 2018-02-10 ENCOUNTER — Ambulatory Visit (INDEPENDENT_AMBULATORY_CARE_PROVIDER_SITE_OTHER): Payer: Self-pay | Admitting: Obstetrics and Gynecology

## 2018-02-10 VITALS — BP 106/66 | HR 78 | Ht 67.0 in | Wt 181.0 lb

## 2018-02-10 DIAGNOSIS — N368 Other specified disorders of urethra: Secondary | ICD-10-CM

## 2018-02-10 DIAGNOSIS — N9419 Other specified dyspareunia: Secondary | ICD-10-CM

## 2018-02-10 DIAGNOSIS — N361 Urethral diverticulum: Secondary | ICD-10-CM

## 2018-02-10 NOTE — Progress Notes (Signed)
   Patient ID: Rebecca Woods, female   DOB: 1996/06/18, 22 y.o.   MRN: [id number]  Reason for Consult: Consult (vaginal cyst excision conference)   Referred by Carlean JewsBoscia, Heather E, NP  Subjective:     HPI:  Rebecca Woods is a 22 y.o. female she is here today for a referral for a periurethral mass. Rebecca Woods is unsure how long this bump has been present. She estimates less than 1 year. It is tender to the touch. It does not cause dysuria. She describes that after she urinates she still has the sensation that she needs to pee again. It is uncomfortable with intercourse. She reports normal vaginal discharge. Denies vulvar or perineal itching or iritation.    Past Medical History:  Diagnosis Date  . Anemia    H/O  . GERD (gastroesophageal reflux disease)    NO MEDS   Family History  Problem Relation Age of Onset  . Thyroid disease Neg Hx    Past Surgical History:  Procedure Laterality Date  . LAPAROSCOPY N/A 05/24/2016   Procedure: LAPAROSCOPY DIAGNOSTIC;  Surgeon: Nadara Mustardobert P Harris, MD;  Location: [facility] ORS;  Service: Gynecology;  Laterality: N/A;  . NO PAST SURGERIES      Short Social History:  Social History   Tobacco Use  . Smoking status: Never Smoker  . Smokeless tobacco: Former Engineer, waterUser  Substance Use Topics  . Alcohol use: Yes    No Known Allergies  No current outpatient medications on file.   No current facility-administered medications for this visit.     Review of Systems  Constitutional: Negative for chills, fatigue, fever and unexpected weight change.  HENT: Negative for trouble swallowing.  Eyes: Negative for loss of vision.  Respiratory: Negative for cough, shortness of breath and wheezing.  Cardiovascular: Negative for chest pain, leg swelling, palpitations and syncope.  GI: Negative for abdominal pain, blood in stool, diarrhea, nausea and vomiting.  GU: Positive for frequency. Negative for difficulty urinating, dysuria and hematuria.  Musculoskeletal: Negative  for back pain, leg pain and joint pain.  Skin: Negative for rash.  Neurological: Negative for dizziness, headaches, light-headedness, numbness and seizures.  Psychiatric: Negative for behavioral problem, confusion, depressed mood and sleep disturbance.        Objective:  Objective   Vitals:   02/10/18 1403  BP: 106/66  Pulse: 78  Weight: 181 lb (82.1 kg)  Height: 5\' 7"  (1.702 m)   Body mass index is 28.35 kg/m.  Physical Exam  Constitutional: She is oriented to person, place, and time. She appears well-developed and well-nourished.  HENT:  Head: Normocephalic and atraumatic.  Eyes: EOM are normal.  Cardiovascular: Normal rate, regular rhythm and normal heart sounds.  Pulmonary/Chest: Effort normal and breath sounds normal.  Genitourinary:     Neurological: She is alert and oriented to person, place, and time.  Skin: Skin is warm and dry.  Psychiatric: She has a normal mood and affect. Her behavior is normal. Judgment and thought content normal.  Nursing note and vitals reviewed.        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 Given the location proximal to and possibly involving the urethra will refer her to urogynecology for a consult and second opinion.   Adelene Idlerhristanna Verginia Toohey MD [facility], [facility] 02/10/18 3:04 PM

## 2018-04-07 ENCOUNTER — Other Ambulatory Visit: Payer: Self-pay | Admitting: Nurse Practitioner

## 2019-07-20 ENCOUNTER — Ambulatory Visit: Payer: BC Managed Care – PPO | Admitting: Nurse Practitioner

## 2019-07-20 ENCOUNTER — Encounter: Payer: Self-pay | Admitting: Nurse Practitioner

## 2019-07-20 ENCOUNTER — Other Ambulatory Visit: Payer: Self-pay

## 2019-07-20 VITALS — BP 110/72 | HR 83 | Temp 97.9°F | Resp 16 | Ht 66.0 in | Wt 174.0 lb

## 2019-07-20 DIAGNOSIS — B9689 Other specified bacterial agents as the cause of diseases classified elsewhere: Secondary | ICD-10-CM

## 2019-07-20 DIAGNOSIS — L02224 Furuncle of groin: Secondary | ICD-10-CM

## 2019-07-20 DIAGNOSIS — N760 Acute vaginitis: Secondary | ICD-10-CM

## 2019-07-20 MED ORDER — METRONIDAZOLE 0.75 % VA GEL: 1.0000 | Freq: Every day | VAGINAL | 0 refills | Status: DC

## 2019-07-20 MED ORDER — SULFAMETHOXAZOLE-TRIMETHOPRIM 800-160 MG PO TABS: 1.0000 | ORAL_TABLET | Freq: Two times a day (BID) | ORAL | 0 refills | Status: DC

## 2019-07-20 NOTE — Progress Notes (Signed)
[facility] [place], [place] [address]  Internal MEDICINE  Office Visit Note  Patient Name: Rebecca Woods  [id number]  [id number]  Date of Service: 08/02/2019   Pt is here for a sick visit.  Chief Complaint  Patient presents with  . SEXUALLY TRANSMITTED DISEASE    possible ingrown hair or boil, uncomfortable, pain to touch, itchy  . Quality Metric Gaps    patient needs a pap??     The patient is here for acute visit. She is concerned about lesion in the right groin. Has been present for about a week. Is gradually getting larger. Is tender. Is unsure if this is a boil or ingrown hair. She does not believe she has been exposed to any sexually transmitted infection. She has had little belly pain, but unrelated to the new lesion. She denies fever.        Current Medication:  Outpatient Encounter Medications as of 07/20/2019  Medication Sig  . metroNIDAZOLE (METROGEL) 0.75 % vaginal gel Place 1 Applicatorful vaginally at bedtime.  . sulfamethoxazole-trimethoprim (BACTRIM DS) 800-160 MG tablet Take 1 tablet by mouth 2 (two) times daily.   No facility-administered encounter medications on file as of 07/20/2019.       Medical History: Past Medical History:  Diagnosis Date  . Anemia    H/O  . GERD (gastroesophageal reflux disease)    NO MEDS     Today's Vitals   07/20/19 1421  BP: 110/72  Pulse: 83  Resp: 16  Temp: 97.9 F (36.6 C)  SpO2: 99%  Weight: 174 lb (78.9 kg)  Height: 5\' 6"  (1.676 m)   Body mass index is 28.08 kg/m.  Review of Systems  Constitutional: Negative for chills, fatigue and unexpected weight change.  HENT: Negative for congestion, postnasal drip, rhinorrhea, sneezing and sore throat.   Respiratory: Negative for cough, chest tightness, shortness of breath and wheezing.   Cardiovascular: Negative for chest pain and palpitations.  Gastrointestinal: Negative for abdominal pain, constipation, diarrhea, nausea and  vomiting.  Genitourinary: Positive for vaginal discharge. Negative for dysuria and frequency.       Ingrown hair like lesion on right side of the groin.   Musculoskeletal: Negative for arthralgias, back pain, joint swelling and neck pain.  Skin: Negative for rash.  Neurological: Negative.  Negative for tremors and numbness.  Hematological: Negative for adenopathy. Does not bruise/bleed easily.  Psychiatric/Behavioral: Negative for behavioral problems (Depression), sleep disturbance and suicidal ideas. The patient is not nervous/anxious.     Physical Exam Vitals signs and nursing note reviewed.  Constitutional:      General: She is not in acute distress.    Appearance: Normal appearance. She is well-developed. She is not diaphoretic.  HENT:     Head: Normocephalic and atraumatic.     Mouth/Throat:     Pharynx: No oropharyngeal exudate.  Eyes:     Pupils: Pupils are equal, round, and reactive to light.  Neck:     Musculoskeletal: Normal range of motion and neck supple.     Thyroid: No thyromegaly.     Vascular: No JVD.     Trachea: No tracheal deviation.  Cardiovascular:     Rate and Rhythm: Normal rate and regular rhythm.     Heart sounds: Normal heart sounds. No murmur. No friction rub. No gallop.   Pulmonary:     Effort: Pulmonary effort is normal. No respiratory distress.     Breath sounds: No wheezing or rales.  Chest:  Chest wall: No tenderness.  Abdominal:     General: Bowel sounds are normal.     Palpations: Abdomen is soft.  Genitourinary:    Comments: There is pustular type lesion on the right side of groin and right labia. It is red and tender. Skin is red and inflamed but intact with no drainage.  Musculoskeletal: Normal range of motion.  Lymphadenopathy:     Cervical: No cervical adenopathy.  Skin:    General: Skin is warm and dry.  Neurological:     Mental Status: She is alert and oriented to person, place, and time.     Cranial Nerves: No cranial nerve  deficit.  Psychiatric:        Behavior: Behavior normal.        Thought Content: Thought content normal.        Judgment: Judgment normal.   Assessment/Plan: 1. Furuncle of groin Start bactrim DS twice daily for 10 days. Advised her to apply warm compress to area to help drain.  - sulfamethoxazole-trimethoprim (BACTRIM DS) 800-160 MG tablet; Take 1 tablet by mouth 2 (two) times daily.  Dispense: 20 tablet; Refill: 0  2. Bacterial vaginitis Metronidazole vaginal gel should be used every night at bedtime for next 7 nights. - metroNIDAZOLE (METROGEL) 0.75 % vaginal gel; Place 1 Applicatorful vaginally at bedtime.  Dispense: 70 g; Refill: 0  General Counseling: Madelynn verbalizes understanding of the findings of todays visit and agrees with plan of treatment. I have discussed any further diagnostic evaluation that may be needed or ordered today. We also reviewed her medications today. she has been encouraged to call the office with any questions or concerns that should arise related to todays visit.    Counseling:  This patient was seen by Vincent Gros FNP Collaboration with Dr Lyndon Code as a part of collaborative care agreement  Meds ordered this encounter  Medications  . sulfamethoxazole-trimethoprim (BACTRIM DS) 800-160 MG tablet    Sig: Take 1 tablet by mouth 2 (two) times daily.    Dispense:  20 tablet    Refill:  0    Order Specific Question:   Supervising Provider    Answer:   Lyndon Code [[id number]]  . metroNIDAZOLE (METROGEL) 0.75 % vaginal gel    Sig: Place 1 Applicatorful vaginally at bedtime.    Dispense:  70 g    Refill:  0    Order Specific Question:   Supervising Provider    Answer:   Lyndon Code [[id number]]    Time spent: 15 Minutes

## 2019-08-02 DIAGNOSIS — N760 Acute vaginitis: Secondary | ICD-10-CM

## 2019-08-02 DIAGNOSIS — B9689 Other specified bacterial agents as the cause of diseases classified elsewhere: Secondary | ICD-10-CM | POA: Insufficient documentation

## 2019-08-02 DIAGNOSIS — L02224 Furuncle of groin: Secondary | ICD-10-CM | POA: Insufficient documentation

## 2019-08-02 HISTORY — DX: Acute vaginitis: N76.0

## 2019-08-02 HISTORY — DX: Acute vaginitis: B96.89

## 2019-09-09 ENCOUNTER — Telehealth: Payer: Self-pay

## 2019-09-09 NOTE — Telephone Encounter (Signed)
LEFT MESSAGE FOR PATIENT TO CONFIRM AND GO OVER SCREENING QUESTIONS FOR 09-11-19 APPOINTMENT. °

## 2019-09-09 NOTE — Telephone Encounter (Signed)
Confirmed appointment with patient. klh °

## 2019-09-11 ENCOUNTER — Encounter: Payer: Self-pay | Admitting: Nurse Practitioner

## 2019-09-11 ENCOUNTER — Ambulatory Visit (INDEPENDENT_AMBULATORY_CARE_PROVIDER_SITE_OTHER): Payer: BC Managed Care – PPO | Admitting: Nurse Practitioner

## 2019-09-11 ENCOUNTER — Other Ambulatory Visit: Payer: Self-pay

## 2019-09-11 VITALS — BP 108/71 | HR 84 | Temp 97.6°F | Resp 16 | Ht 66.0 in | Wt 175.0 lb

## 2019-09-11 DIAGNOSIS — Z124 Encounter for screening for malignant neoplasm of cervix: Secondary | ICD-10-CM | POA: Diagnosis not present

## 2019-09-11 DIAGNOSIS — Z0001 Encounter for general adult medical examination with abnormal findings: Secondary | ICD-10-CM

## 2019-09-11 DIAGNOSIS — R300 Dysuria: Secondary | ICD-10-CM | POA: Diagnosis not present

## 2019-09-11 DIAGNOSIS — N760 Acute vaginitis: Secondary | ICD-10-CM | POA: Diagnosis not present

## 2019-09-11 NOTE — Progress Notes (Signed)
[facility] [place], [place] [address]  Internal MEDICINE  Office Visit Note  Patient Name: Rebecca Woods  [id number]  [id number]  Date of Service: 09/26/2019  Chief Complaint  Patient presents with  . Annual Exam  . Gynecologic Exam    Ms. Bullis presents to clinic today for an annual physical. She reports that she has noticed an increase in her normal vaginal discharge since she had her baby 6 years ago. She reports that it is usually clear and occasionally malodorous. She denies any other complaints or questions.      Current Medication: Outpatient Encounter Medications as of 09/11/2019  Medication Sig  . [DISCONTINUED] metroNIDAZOLE (METROGEL) 0.75 % vaginal gel Place 1 Applicatorful vaginally at bedtime. (Patient not taking: Reported on 09/11/2019)  . [DISCONTINUED] sulfamethoxazole-trimethoprim (BACTRIM DS) 800-160 MG tablet Take 1 tablet by mouth 2 (two) times daily. (Patient not taking: Reported on 09/11/2019)   No facility-administered encounter medications on file as of 09/11/2019.     Surgical History: Past Surgical History:  Procedure Laterality Date  . LAPAROSCOPY N/A 05/24/2016   Procedure: LAPAROSCOPY DIAGNOSTIC;  Surgeon: Gae Dry, MD;  Location: [facility] ORS;  Service: Gynecology;  Laterality: N/A;  . NO PAST SURGERIES      Medical History: Past Medical History:  Diagnosis Date  . Anemia    H/O  . GERD (gastroesophageal reflux disease)    NO MEDS    Family History: Family History  Problem Relation Age of Onset  . Thyroid disease Neg Hx     Social History   Socioeconomic History  . Marital status: Single    Spouse name: Not on file  . Number of children: Not on file  . Years of education: Not on file  . Highest education level: Not on file  Occupational History  . Not on file  Social Needs  . Financial resource strain: Not on file  . Food insecurity    Worry: Not on file    Inability: Not on file  .  Transportation needs    Medical: Not on file    Non-medical: Not on file  Tobacco Use  . Smoking status: Former Smoker    Packs/day: 0.00    Years: 1.00    Pack years: 0.00  . Smokeless tobacco: Never Used  Substance and Sexual Activity  . Alcohol use: Yes  . Drug use: Never  . Sexual activity: Yes    Birth control/protection: I.U.D.  Lifestyle  . Physical activity    Days per week: Not on file    Minutes per session: Not on file  . Stress: Not on file  Relationships  . Social Herbalist on phone: Not on file    Gets together: Not on file    Attends religious service: Not on file    Active member of club or organization: Not on file    Attends meetings of clubs or organizations: Not on file    Relationship status: Not on file  . Intimate partner violence    Fear of current or ex partner: Not on file    Emotionally abused: Not on file    Physically abused: Not on file    Forced sexual activity: Not on file  Other Topics Concern  . Not on file  Social History Narrative  . Not on file      Review of Systems  Constitutional: Negative for appetite change, chills, fever and unexpected weight change.  HENT: Negative  for congestion, ear pain, hearing loss, rhinorrhea, sinus pressure, sinus pain, sore throat and tinnitus.   Respiratory: Negative for cough, shortness of breath and wheezing.   Cardiovascular: Negative for chest pain and palpitations.  Gastrointestinal: Negative for constipation, diarrhea, nausea and vomiting.       Occasional abdominal pain/bloating with overeating  Endocrine: Negative for cold intolerance, heat intolerance, polydipsia and polyuria.  Genitourinary: Positive for vaginal discharge. Negative for difficulty urinating, hematuria and vaginal pain.  Musculoskeletal: Negative for arthralgias, back pain and myalgias.  Skin: Negative for rash and wound.  Allergic/Immunologic: Negative for environmental allergies and food allergies.   Neurological: Negative for dizziness, weakness, light-headedness, numbness and headaches.  Psychiatric/Behavioral: Negative for dysphoric mood. The patient is not nervous/anxious.     Today's Vitals   09/11/19 1514  BP: 108/71  Pulse: 84  Resp: 16  Temp: 97.6 F (36.4 C)  Weight: 175 lb (79.4 kg)  Height: 5\' 6"  (1.676 m)   Body mass index is 28.25 kg/m.   Physical Exam Vitals signs and nursing note reviewed.  Constitutional:      Appearance: Normal appearance.  HENT:     Head: Normocephalic and atraumatic.     Nose: Nose normal.  Eyes:     Extraocular Movements: Extraocular movements intact.     Pupils: Pupils are equal, round, and reactive to light.  Neck:     Musculoskeletal: Normal range of motion and neck supple.  Cardiovascular:     Rate and Rhythm: Normal rate and regular rhythm.     Pulses: Normal pulses.     Heart sounds: Normal heart sounds.  Pulmonary:     Effort: Pulmonary effort is normal.     Breath sounds: Normal breath sounds.  Chest:     Breasts:        Right: Normal. No swelling, bleeding, inverted nipple, mass, nipple discharge, skin change or tenderness.        Left: Normal. No swelling, bleeding, inverted nipple, mass, nipple discharge, skin change or tenderness.  Abdominal:     General: Abdomen is flat. Bowel sounds are normal.     Palpations: Abdomen is soft.     Tenderness: There is abdominal tenderness. There is no guarding or rebound.     Comments: LLQ tenderness on palpation, pt rated 5/10 severity  Genitourinary:    General: Normal vulva.     Exam position: Supine.     Labia:        Right: No tenderness.        Left: No tenderness.      Vagina: Normal. No vaginal discharge, tenderness or bleeding.     Cervix: No cervical motion tenderness, discharge or erythema.     Uterus: Normal.      Adnexa: Right adnexa normal and left adnexa normal.     Comments: No tenderness, masses, or organomeglay present during bimanual exam  . Musculoskeletal: Normal range of motion.  Lymphadenopathy:     Lower Body: No right inguinal adenopathy. No left inguinal adenopathy.  Skin:    General: Skin is warm and dry.     Capillary Refill: Capillary refill takes less than 2 seconds.  Neurological:     Mental Status: She is alert and oriented to person, place, and time.  Psychiatric:        Mood and Affect: Mood normal.        Behavior: Behavior normal.        Thought Content: Thought content normal.    Assessment/Plan:  1. Encounter for general adult medical examination with abnormal findings Annual health maintenance exam with pap smear today.   2. Acute vaginitis NuSwab specimen obtained for STD screening.  - Chlamydia/Gonococcus/Trichomonas, NAA  3. Routine cervical smear - Pap IG and HPV (high risk) DNA detection  4. Dysuria - UA/M w/rflx Culture, Routine   General Counseling: Bianey verbalizes understanding of the findings of todays visit and agrees with plan of treatment. I have discussed any further diagnostic evaluation that may be needed or ordered today. We also reviewed her medications today. she has been encouraged to call the office with any questions or concerns that should arise related to todays visit.  This patient was seen by Vincent GrosHeather Stefano Trulson FNP Collaboration with Dr Lyndon CodeFozia M Khan as a part of collaborative care agreement  Orders Placed This Encounter  Procedures  . Chlamydia/Gonococcus/Trichomonas, NAA  . Microscopic Examination  . UA/M w/rflx Culture, Routine      Time spent: 7130 Minutes      Dr Lyndon CodeFozia M Khan Internal medicine

## 2019-09-12 LAB — MICROSCOPIC EXAMINATION: Casts: NONE SEEN /lpf

## 2019-09-12 LAB — UA/M W/RFLX CULTURE, ROUTINE
Bilirubin, UA: NEGATIVE
Glucose, UA: NEGATIVE
Leukocytes,UA: NEGATIVE
Nitrite, UA: NEGATIVE
Protein,UA: NEGATIVE
RBC, UA: NEGATIVE
Specific Gravity, UA: >=1.03 — AB (ref 1.005–1.030)
Urobilinogen, Ur: 0.2 mg/dL (ref 0.2–1.0)
pH, UA: 5 (ref 5.0–7.5)

## 2019-09-17 NOTE — Progress Notes (Signed)
Waiting on frther results.

## 2019-09-18 LAB — CHLAMYDIA/GONOCOCCUS/TRICHOMONAS, NAA
Chlamydia by NAA: NEGATIVE
Gonococcus by NAA: NEGATIVE
Trich vag by NAA: NEGATIVE

## 2019-09-19 LAB — PAP IG AND HPV HIGH-RISK: HPV, high-risk: NEGATIVE

## 2019-09-21 ENCOUNTER — Telehealth: Payer: Self-pay

## 2019-09-21 NOTE — Telephone Encounter (Signed)
Pt notified of normal pap

## 2019-09-21 NOTE — Telephone Encounter (Signed)
-----   Message from Ronnell Freshwater, NP sent at 09/21/2019  1:04 PM EST ----- Please let the patient know that pap smear is normal. Thanks.

## 2019-09-21 NOTE — Progress Notes (Signed)
Please let the patient know that pap smear is normal. Thanks

## 2019-09-26 DIAGNOSIS — N760 Acute vaginitis: Secondary | ICD-10-CM

## 2019-09-26 DIAGNOSIS — Z124 Encounter for screening for malignant neoplasm of cervix: Secondary | ICD-10-CM | POA: Insufficient documentation

## 2019-09-26 DIAGNOSIS — R300 Dysuria: Secondary | ICD-10-CM | POA: Insufficient documentation

## 2019-09-26 HISTORY — DX: Acute vaginitis: N76.0

## 2019-10-30 NOTE — L&D Delivery Note (Signed)
Date of delivery: 07/01/2020 Estimated Date of Delivery: 07/27/20 Patient's last menstrual period was 10/21/2019 (exact date). EGA: [redacted]w[redacted]d  Delivery Note At 4:12 PM a viable female was delivered via Vaginal, Spontaneous (Presentation: OA, ROA).  APGAR: 9, 9; weight: pending.   Placenta status: Spontaneous, Intact.  Cord: 3 vessels with the following complications: None.  Cord pH: NA  Called to see patient.  Mom pushed well to deliver a viable female infant.  The head followed by shoulders, which delivered without difficulty, and the rest of the body.  No nuchal cord noted.  Baby to mom's chest.  Cord clamped and cut after 3 min delay.  Cord blood obtained.  Placenta delivered spontaneously, intact, with a 3-vessel cord. No vaginal, cervical or perineal lacerations. All counts correct.  Hemostasis obtained with IV pitocin and fundal massage.   Anesthesia: None Episiotomy: None Lacerations: None Suture Repair: NA Est. Blood Loss (mL): 200  Mom to postpartum.  Baby to Couplet care / Skin to Skin.  Tresea Mall, CNM 07/01/2020, 4:54 PM

## 2019-12-01 ENCOUNTER — Ambulatory Visit: Payer: Medicaid Other

## 2019-12-01 ENCOUNTER — Other Ambulatory Visit: Payer: Self-pay

## 2019-12-01 VITALS — BP 112/70 | Ht 66.0 in | Wt 174.0 lb

## 2019-12-01 DIAGNOSIS — Z3201 Encounter for pregnancy test, result positive: Secondary | ICD-10-CM

## 2019-12-01 LAB — PREGNANCY, URINE: Preg Test, Ur: POSITIVE — AB

## 2019-12-01 NOTE — Progress Notes (Signed)
Pt plans care at [facility]; sent to preadmit. Pt declines PNV, states she will get Prenatal Gummies. Reviewed [facility] record, unable to print copy.

## 2019-12-17 ENCOUNTER — Other Ambulatory Visit: Payer: BC Managed Care – PPO

## 2019-12-17 ENCOUNTER — Ambulatory Visit (INDEPENDENT_AMBULATORY_CARE_PROVIDER_SITE_OTHER): Payer: BC Managed Care – PPO

## 2019-12-17 ENCOUNTER — Other Ambulatory Visit: Payer: Self-pay

## 2019-12-17 ENCOUNTER — Encounter: Payer: Self-pay | Admitting: Certified Nurse Midwife

## 2019-12-17 ENCOUNTER — Other Ambulatory Visit: Payer: Self-pay | Admitting: Certified Nurse Midwife

## 2019-12-17 ENCOUNTER — Ambulatory Visit (INDEPENDENT_AMBULATORY_CARE_PROVIDER_SITE_OTHER): Payer: BC Managed Care – PPO | Admitting: Certified Nurse Midwife

## 2019-12-17 VITALS — BP 110/60 | Ht 66.0 in | Wt 172.0 lb

## 2019-12-17 DIAGNOSIS — Z3490 Encounter for supervision of normal pregnancy, unspecified, unspecified trimester: Secondary | ICD-10-CM

## 2019-12-17 DIAGNOSIS — Z3A08 8 weeks gestation of pregnancy: Secondary | ICD-10-CM

## 2019-12-17 DIAGNOSIS — Z3481 Encounter for supervision of other normal pregnancy, first trimester: Secondary | ICD-10-CM

## 2019-12-17 DIAGNOSIS — Z113 Encounter for screening for infections with a predominantly sexual mode of transmission: Secondary | ICD-10-CM

## 2019-12-17 DIAGNOSIS — Z3689 Encounter for other specified antenatal screening: Secondary | ICD-10-CM

## 2019-12-17 DIAGNOSIS — N912 Amenorrhea, unspecified: Secondary | ICD-10-CM

## 2019-12-17 LAB — POCT URINALYSIS DIPSTICK OB
Glucose, UA: NEGATIVE
POC,PROTEIN,UA: NEGATIVE

## 2019-12-17 LAB — OB RESULTS CONSOLE VARICELLA ZOSTER ANTIBODY, IGG: Varicella: IMMUNE

## 2019-12-17 NOTE — Progress Notes (Signed)
New Obstetric Patient H&P    Chief Complaint: "Desires prenatal care"   History of Present Illness: Rebecca Woods is a 24 y.o. G2P1001 Hispanic or Latino female, LMP 10/21/2019 who presents with amenorrhea and positive home pregnancy test. Based on her  LMP, her EDD is Estimated Date of Delivery: 07/27/20 and her EGA is [redacted]w[redacted]d. Cycles are 5-6. days, regular, and occur approximately every : 28-30 days. Her last pap smear was 09/11/2019 years ago and was NIL/negative HRHPV.    She had a urine pregnancy test which was first positive 11/26/2019. Her last menstrual period was normal and lasted for  5 day(s). Since her LMP she claims she has experienced breast tenderness, nausea, headaches, terminal dysuria and cramping. She denies vaginal bleeding. Her past medical history is remarkable for headaches, anemia, and marijuana use.  Her prior pregnancies are notable for frequent UTIs. She had a SVD of a 7#2oz baby girl in 2014.   Since her LMP, she admits to the use of tobacco products  No Since her LMP , she admits to drinking whiskey and beer prior to her + UPT SInce her LMP she has used marijuana (not since +UPT) She claims she has lost 2# since the start of her pregnancy.  There are cats in the home in the home  no She admits close contact with children on a regular basis  yes  She has had chicken pox in the past no She has had Tuberculosis exposures, symptoms, or previously tested positive for TB   no Current or past history of domestic violence. no  Genetic Screening/Teratology Counseling: (Includes patient, baby's father, or anyone in either family with:)   1. Patient's age >/= 9 at [facility]  no 2. Thalassemia ([place], [place], Mediterranean, or Asian background): MCV<80  no 3. Neural tube defect (meningomyelocele, spina bifida, anencephaly)  no 4. Congenital heart defect:  her brother and sister had small septal defects, but apparently they resolved spontaneously 5. Down syndrome  Yes.  A maternal cousin has Down syndrome 6. Tay-Sachs (Jewish, [place]  no 7. Canavan's Disease  no 8. Sickle cell disease or trait (African)  no  9. Hemophilia or other blood disorders  no  10. Muscular dystrophy  no  11. Cystic fibrosis  no  12. Huntington's Chorea  no  13. Mental retardation/autism  no 14. Other inherited genetic or chromosomal disorder  no 15. Maternal metabolic disorder (DM, PKU, etc)  no 16. Patient or FOB with a child with a birth defect not listed above no  16a. Patient or FOB with a birth defect themselves no 17. Recurrent pregnancy loss, or stillbirth  no  18. Any medications since LMP other than prenatal vitamins (include vitamins, supplements, OTC meds, drugs, alcohol)  Yes, Ella and Plan B, marijuana, whiskey and beer (prior to + UPT) and Tylenol for headaches 19. Any other genetic/environmental exposure to discuss  no  Infection History:   1. Lives with someone with TB or TB exposed  no  2. Patient or partner has history of genital herpes  no 3. Rash or viral illness since LMP  no 4. History of STI (GC, CT, HPV, syphilis, HIV)  no 5. History of recent travel :  no  Other pertinent information:  Yes, FOB is Rebecca Woods-age 89    Review of Systems:10 point review of systems negative unless otherwise noted in HPI  Past Medical History:  Past Medical History:  Diagnosis Date  . Anemia  H/O  . GERD (gastroesophageal reflux disease)    NO MEDS    Past Surgical History:  Past Surgical History:  Procedure Laterality Date  . LAPAROSCOPY N/A 05/24/2016   Procedure: LAPAROSCOPY DIAGNOSTIC;  Surgeon: Nadara Mustard, MD;  Location: [facility] ORS;  Service: Gynecology;  Laterality: N/A;    Gynecologic History: Patient's last menstrual period was 10/21/2019 (exact date).  Obstetric History: G2P1001 OB History  Gravida Para Term Preterm AB Living  2 1 1  0 0 1  SAB TAB Ectopic Multiple Live Births  0 0 0 0 1    # Outcome Date GA Lbr Len/2nd Weight Sex  Delivery Anes PTL Lv  2 Current           1 Term 08/17/13 [redacted]w[redacted]d  7 lb 2 oz (3.232 kg) F Vag-Spont  N LIV   Family History:  Family History  Problem Relation Age of Onset  . Cancer Paternal Grandfather        STOMACH OR COLON NOT SURE  . Thyroid disease Neg Hx     Social History:  Social History   Socioeconomic History  . Marital status: Single    Spouse name: Not on file  . Number of children: 1  . Years of education: Not on file  . Highest education level: High school graduate  Occupational History  . Occupation: ADMIN    Comment: [employer]  Tobacco Use  . Smoking status: Never Smoker  . Smokeless tobacco: Never Used  Substance and Sexual Activity  . Alcohol use: Not Currently  . Drug use: Not Currently    Types: Marijuana  . Sexual activity: Yes    Birth control/protection: None  Other Topics Concern  . Not on file  Social History Narrative  . Not on file   Social Determinants of Health   Financial Resource Strain:   . Difficulty of Paying Living Expenses: Not on file  Food Insecurity:   . Worried About [redacted]w[redacted]d in the Last Year: Not on file  . Ran Out of Food in the Last Year: Not on file  Transportation Needs:   . Lack of Transportation (Medical): Not on file  . Lack of Transportation (Non-Medical): Not on file  Physical Activity:   . Days of Exercise per Week: Not on file  . Minutes of Exercise per Session: Not on file  Stress:   . Feeling of Stress : Not on file  Social Connections:   . Frequency of Communication with Friends and Family: Not on file  . Frequency of Social Gatherings with Friends and Family: Not on file  . Attends Religious Services: Not on file  . Active Member of Clubs or Organizations: Not on file  . Attends Programme researcher, broadcasting/film/video Meetings: Not on file  . Marital Status: Not on file  Intimate Partner Violence:   . Fear of Current or Ex-Partner: Not on file  . Emotionally Abused: Not on file  . Physically Abused: Not on file   . Sexually Abused: Not on file    Allergies:  No Known Allergies  Medications: Prior to Admission medications   Medication Sig Start Date End Date Taking? Authorizing Provider  Prenatal MV-Min-FA-Omega-3 (PRENATAL GUMMIES/DHA & FA) 0.4-32.5 MG CHEW Chew 1 tablet by mouth daily.   Yes [provider]    Physical Exam Vitals: BP 110/60   Wt 172 lb (78 kg)   LMP 10/21/2019 (Exact Date) Comment: normal  BMI 27.76 kg/m   General: Hispanic female in NAD  HEENT: normocephalic, anicteric, PEARL Thyroid: no enlargement, no palpable nodules Breasts: soft, tender, no masses, no skin or nipple retraction. No axillary, supraclavicular or infraclavicular LAN. Pulmonary: No increased work of breathing, CTAB Cardiovascular: RRR, without murmur Abdomen: soft, non-tender, non-distended.  Umbilicus without lesions.  No hepatomegaly or masses palpable. No evidence of hernia  Genitourinary:  External: Normal external female genitalia.  Normal urethral meatus, normal Bartholin's and Skene's glands.    Vagina: Normal vaginal mucosa, no evidence of prolapse.    Cervix: posterior, multip, no bleeding  Uterus: AF and rotated to the right, 8 week size, mobile, normal contour.    Adnexa: ovaries non-enlarged, no adnexal masses  Rectal: deferred Extremities: no edema, erythema, or tenderness Neurologic: Grossly intact Psychiatric: mood appropriate, affect full   Assessment: 24 y.o. G2P1001 at [redacted]w[redacted]d presenting to initiate prenatal care  Plan: 1) Avoid alcoholic beverages. 2) Patient encouraged not to smoke.  3) Discontinue the use of all non-medicinal drugs and chemicals.  4) Take prenatal vitamins daily.  5) Nutrition, food safety (fish, cheese advisories, and high nitrite foods) and exercise discussed. 6) Hospital and practice style discussed with cross coverage system.  7) Genetic Screening, such as with 1st Trimester Screening, cell free fetal DNA, AFP testing, and Ultrasound  is  discussed with patient. At the conclusion of today's visit patient requested genetic testing. She desires NIPT.  8) Patient is asked about travel to areas at risk for the Zika virus, and counseled to avoid travel and exposure to mosquitoes or sexual partners who may have themselves been exposed to the virus. Testing is discussed, and will be ordered as appropriate.  9) TVUS today: SIUP with CRL c/w 8wk5d gestation confirming dates by LMP. FCA was WNL. 10) NOB labs done, urine culture ordered, Aptima done 11) RTO in 2-3 weeks for MaterniTi21 test and UDS.  Dalia Heading, CNM    No concerns.rj

## 2019-12-19 LAB — CHLAMYDIA/GONOCOCCUS/TRICHOMONAS, NAA
Chlamydia by NAA: NEGATIVE
Gonococcus by NAA: NEGATIVE
Trich vag by NAA: NEGATIVE

## 2019-12-19 LAB — URINE CULTURE

## 2019-12-21 LAB — RPR+RH+ABO+RUB AB+AB SCR+CB...
Antibody Screen: NEGATIVE
HIV Screen 4th Generation wRfx: NONREACTIVE
Hematocrit: 38.7 % (ref 34.0–46.6)
Hemoglobin: 12.8 g/dL (ref 11.1–15.9)
Hepatitis B Surface Ag: NEGATIVE
MCH: 27 pg (ref 26.6–33.0)
MCHC: 33.1 g/dL (ref 31.5–35.7)
MCV: 82 fL (ref 79–97)
Platelets: 257 10*3/uL (ref 150–450)
RBC: 4.74 x10E6/uL (ref 3.77–5.28)
RDW: 12.9 % (ref 11.7–15.4)
RPR Ser Ql: NONREACTIVE
Rh Factor: POSITIVE
Rubella Antibodies, IGG: <0.9 index — ABNORMAL LOW (ref >0.99)
Varicella zoster IgG: 169 index (ref >165)
WBC: 7.7 10*3/uL (ref 3.4–10.8)

## 2019-12-21 LAB — HEMOGLOBINOPATHY EVALUATION
HGB C: 0 %
HGB S: 0 %
HGB VARIANT: 0 %
Hemoglobin A2 Quantitation: 2.2 % (ref 1.8–3.2)
Hemoglobin F Quantitation: 0 % (ref 0.0–2.0)
Hgb A: 97.8 % (ref 96.4–98.8)

## 2020-01-01 ENCOUNTER — Ambulatory Visit (INDEPENDENT_AMBULATORY_CARE_PROVIDER_SITE_OTHER): Payer: BC Managed Care – PPO | Admitting: Certified Nurse Midwife

## 2020-01-01 ENCOUNTER — Other Ambulatory Visit: Payer: Self-pay

## 2020-01-01 VITALS — BP 96/60 | Temp 97.1°F | Wt 173.0 lb

## 2020-01-01 DIAGNOSIS — Z3A10 10 weeks gestation of pregnancy: Secondary | ICD-10-CM

## 2020-01-01 DIAGNOSIS — Z1379 Encounter for other screening for genetic and chromosomal anomalies: Secondary | ICD-10-CM

## 2020-01-01 DIAGNOSIS — R21 Rash and other nonspecific skin eruption: Secondary | ICD-10-CM

## 2020-01-01 DIAGNOSIS — Z3491 Encounter for supervision of normal pregnancy, unspecified, first trimester: Secondary | ICD-10-CM

## 2020-01-01 DIAGNOSIS — Z3490 Encounter for supervision of normal pregnancy, unspecified, unspecified trimester: Secondary | ICD-10-CM

## 2020-01-01 LAB — POCT URINALYSIS DIPSTICK OB
Glucose, UA: NEGATIVE
POC,PROTEIN,UA: NEGATIVE

## 2020-01-01 MED ORDER — TRIAMCINOLONE ACETONIDE 0.1 % EX CREA: 1.0000 "application " | TOPICAL_CREAM | Freq: Two times a day (BID) | CUTANEOUS | 0 refills | Status: DC

## 2020-01-01 NOTE — Progress Notes (Signed)
C/o ? Rash on right side of neck.rj

## 2020-01-02 LAB — URINE DRUG PANEL 7
Amphetamines, Urine: NEGATIVE ng/mL
Barbiturate Quant, Ur: NEGATIVE ng/mL
Benzodiazepine Quant, Ur: NEGATIVE ng/mL
Cannabinoid Quant, Ur: NEGATIVE ng/mL
Cocaine (Metab.): NEGATIVE ng/mL
Opiate Quant, Ur: NEGATIVE ng/mL
PCP Quant, Ur: NEGATIVE ng/mL

## 2020-01-03 NOTE — Progress Notes (Signed)
[name] at [place]: Doing well. Complains of a itchy light brown macular rash around the nape of her neck on the right side. X weeks. Trial of Kenalog 0.1% cream for dermatitis. FHTs WNL Reviewed NOB lab results: Rubella NI-rec MMR pp MaterniT 21 today [name] in 4 weeks  Dalia Heading, [place]

## 2020-01-08 ENCOUNTER — Telehealth: Payer: Self-pay | Admitting: Certified Nurse Midwife

## 2020-01-08 LAB — MATERNIT 21 PLUS CORE, BLOOD
Fetal Fraction: 7
Result (T21): NEGATIVE
Trisomy 13 (Patau syndrome): NEGATIVE
Trisomy 18 (Edwards syndrome): NEGATIVE
Trisomy 21 (Down syndrome): NEGATIVE

## 2020-01-08 NOTE — Telephone Encounter (Signed)
Patient is calling to find out if her results are in for her lab work? Please advise

## 2020-01-08 NOTE — Telephone Encounter (Signed)
Pt left msg on triage asking for maternit21 results. Advised they were not back yet. She asked if she would be able to see these results via mychart-YES. Advised her we would call her back whenever they come back.

## 2020-01-28 ENCOUNTER — Other Ambulatory Visit: Payer: Self-pay

## 2020-01-28 ENCOUNTER — Ambulatory Visit (INDEPENDENT_AMBULATORY_CARE_PROVIDER_SITE_OTHER): Payer: BC Managed Care – PPO | Admitting: Certified Nurse Midwife

## 2020-01-28 VITALS — BP 110/62 | Wt 171.0 lb

## 2020-01-28 DIAGNOSIS — Z363 Encounter for antenatal screening for malformations: Secondary | ICD-10-CM

## 2020-01-28 DIAGNOSIS — Z3A14 14 weeks gestation of pregnancy: Secondary | ICD-10-CM

## 2020-01-28 DIAGNOSIS — Z3492 Encounter for supervision of normal pregnancy, unspecified, second trimester: Secondary | ICD-10-CM

## 2020-01-28 DIAGNOSIS — Z3490 Encounter for supervision of normal pregnancy, unspecified, unspecified trimester: Secondary | ICD-10-CM

## 2020-01-28 LAB — POCT URINALYSIS DIPSTICK OB
Glucose, UA: NEGATIVE
POC,PROTEIN,UA: NEGATIVE

## 2020-01-28 NOTE — Progress Notes (Signed)
[name] at 14wk1d: Doing well. Some BLA cramping. No vaginal bleeding  FHT 155 and FH at 1/2 between the U and SP.  A: IUP at 14wk1d  P: Anatomy scan and [name] in 5 weeks  Farrel Conners, CNM

## 2020-01-28 NOTE — Progress Notes (Signed)
[name] No concerns Denies lof, no vb, Slight cramping

## 2020-03-04 ENCOUNTER — Encounter: Payer: Medicaid Other | Admitting: Certified Nurse Midwife

## 2020-03-04 ENCOUNTER — Ambulatory Visit (INDEPENDENT_AMBULATORY_CARE_PROVIDER_SITE_OTHER): Payer: BC Managed Care – PPO

## 2020-03-04 ENCOUNTER — Ambulatory Visit (INDEPENDENT_AMBULATORY_CARE_PROVIDER_SITE_OTHER): Payer: Medicaid Other | Admitting: Certified Nurse Midwife

## 2020-03-04 ENCOUNTER — Other Ambulatory Visit: Payer: Medicaid Other

## 2020-03-04 ENCOUNTER — Other Ambulatory Visit: Payer: Self-pay

## 2020-03-04 VITALS — BP 96/54 | Wt 176.0 lb

## 2020-03-04 DIAGNOSIS — Z363 Encounter for antenatal screening for malformations: Secondary | ICD-10-CM | POA: Diagnosis not present

## 2020-03-04 DIAGNOSIS — Z3492 Encounter for supervision of normal pregnancy, unspecified, second trimester: Secondary | ICD-10-CM

## 2020-03-04 DIAGNOSIS — Z3A20 20 weeks gestation of pregnancy: Secondary | ICD-10-CM | POA: Diagnosis not present

## 2020-03-04 DIAGNOSIS — Z3490 Encounter for supervision of normal pregnancy, unspecified, unspecified trimester: Secondary | ICD-10-CM

## 2020-03-04 DIAGNOSIS — Z3A19 19 weeks gestation of pregnancy: Secondary | ICD-10-CM

## 2020-03-04 LAB — POCT URINALYSIS DIPSTICK OB
Glucose, UA: NEGATIVE
POC,PROTEIN,UA: NEGATIVE

## 2020-03-04 NOTE — Progress Notes (Signed)
[name] and anatomy scan at 19wk2d: Doing well. Feeling some FM.  Anatomy scan: CGA 20wk3d, posterior fundal placenta, normal anatomy, female gender. FCA 148  A: IUP at 19wk2d with normal growth on ultrasound  P: [name] 4 weeks  Farrel Conners, CNM

## 2020-03-04 NOTE — Progress Notes (Signed)
[name] Anatomy scan today 

## 2020-03-07 ENCOUNTER — Telehealth: Payer: Self-pay

## 2020-03-07 NOTE — Telephone Encounter (Signed)
Spoke w/patient. Advised no specific brand. Make sure it contains Folic Acid. Patient reports she has been feeling tired lately. Advised can add otc iron supplement, not harmful to her or baby. Iron will be checked with 28 wk labs.

## 2020-03-07 NOTE — Telephone Encounter (Signed)
Patient inquiring what Prenatal vitamins we recommend. She realized the ones she is taking do not contain iron. GY#[phone]

## 2020-03-22 ENCOUNTER — Encounter: Payer: Medicaid Other | Admitting: Obstetrics

## 2020-03-23 ENCOUNTER — Other Ambulatory Visit: Payer: Self-pay

## 2020-03-23 ENCOUNTER — Ambulatory Visit (INDEPENDENT_AMBULATORY_CARE_PROVIDER_SITE_OTHER): Payer: Medicaid Other | Admitting: Advanced Practice Midwife

## 2020-03-23 ENCOUNTER — Encounter: Payer: Self-pay | Admitting: Advanced Practice Midwife

## 2020-03-23 VITALS — BP 111/69 | Wt 179.0 lb

## 2020-03-23 DIAGNOSIS — K429 Umbilical hernia without obstruction or gangrene: Secondary | ICD-10-CM

## 2020-03-23 DIAGNOSIS — Z3492 Encounter for supervision of normal pregnancy, unspecified, second trimester: Secondary | ICD-10-CM

## 2020-03-23 DIAGNOSIS — Z3A22 22 weeks gestation of pregnancy: Secondary | ICD-10-CM

## 2020-03-23 NOTE — Patient Instructions (Signed)
Umbilical Hernia, Adult  A hernia is a bulge of tissue that pushes through an opening between muscles. An umbilical hernia happens in the abdomen, near the belly button (umbilicus). The hernia may contain tissues from the small intestine, large intestine, or fatty tissue covering the intestines (omentum). Umbilical hernias in adults tend to get worse over time, and they require surgical treatment. There are several types of umbilical hernias. You may have:  A hernia located just above or below the umbilicus (indirect hernia). This is the most common type of umbilical hernia in adults.  A hernia that forms through an opening formed by the umbilicus (direct hernia).  A hernia that comes and goes (reducible hernia). A reducible hernia may be visible only when you strain, lift something heavy, or cough. This type of hernia can be pushed back into the abdomen (reduced).  A hernia that traps abdominal tissue inside the hernia (incarcerated hernia). This type of hernia cannot be reduced.  A hernia that cuts off blood flow to the tissues inside the hernia (strangulated hernia). The tissues can start to die if this happens. This type of hernia requires emergency treatment. What are the causes? An umbilical hernia happens when tissue inside the abdomen presses on a weak area of the abdominal muscles. What increases the risk? You may have a greater risk of this condition if you:  Are obese.  Have had several pregnancies.  Have a buildup of fluid inside your abdomen (ascites).  Have had surgery that weakens the abdominal muscles. What are the signs or symptoms? The main symptom of this condition is a painless bulge at or near the belly button. A reducible hernia may be visible only when you strain, lift something heavy, or cough. Other symptoms may include:  Dull pain.  A feeling of pressure. Symptoms of a strangulated hernia may include:  Pain that gets increasingly worse.  Nausea and  vomiting.  Pain when pressing on the hernia.  Skin over the hernia becoming red or purple.  Constipation.  Blood in the stool. How is this diagnosed? This condition may be diagnosed based on:  A physical exam. You may be asked to cough or strain while standing. These actions increase the pressure inside your abdomen and force the hernia through the opening in your muscles. Your health care provider may try to reduce the hernia by pressing on it.  Your symptoms and medical history. How is this treated? Surgery is the only treatment for an umbilical hernia. Surgery for a strangulated hernia is done as soon as possible. If you have a small hernia that is not incarcerated, you may need to lose weight before having surgery. Follow these instructions at home:  Lose weight, if told by your health care provider.  Do not try to push the hernia back in.  Watch your hernia for any changes in color or size. Tell your health care provider if any changes occur.  You may need to avoid activities that increase pressure on your hernia.  Do not lift anything that is heavier than 10 lb (4.5 kg) until your health care provider says that this is safe.  Take over-the-counter and prescription medicines only as told by your health care provider.  Keep all follow-up visits as told by your health care provider. This is important. Contact a health care provider if:  Your hernia gets larger.  Your hernia becomes painful. Get help right away if:  You develop sudden, severe pain near the area of your hernia.    You have pain as well as nausea or vomiting.  You have pain and the skin over your hernia changes color.  You develop a fever. This information is not intended to replace advice given to you by your health care provider. Make sure you discuss any questions you have with your health care provider. Document Revised: 11/27/2017 Document Reviewed: 04/15/2017 Elsevier Patient Education  2020  Elsevier Inc.  

## 2020-03-23 NOTE — Progress Notes (Signed)
[name] Painful lump on bellybutton

## 2020-03-23 NOTE — Progress Notes (Signed)
  Routine Prenatal Care Visit  Subjective  Rebecca Woods is a 24 y.o. G2P1001 at [redacted]w[redacted]d being seen today for ongoing prenatal care.  She is currently monitored for the following issues for this low-risk pregnancy and has Furuncle of groin; Dysuria; Encounter for supervision of low-risk pregnancy, antepartum; and Umbilical hernia on their problem list.  ----------------------------------------------------------------------------------- Patient reports a painful lump near her belly button that she has recently become aware of. It is tender to touch and is more painful when she is working out. She denies redness, fever, drainage, signs of infection. We discussed umbilical hernia management/treatment/comfort measures/precautions.     . Vag. Bleeding: None.  Movement: Present. Leaking Fluid denies.  ----------------------------------------------------------------------------------- The following portions of the patient's history were reviewed and updated as appropriate: allergies, current medications, past family history, past medical history, past social history, past surgical history and problem list. Problem list updated.  Objective  Blood pressure 111/69, weight 179 lb (81.2 kg), last menstrual period 10/21/2019. Pregravid weight 174 lb (78.9 kg) Total Weight Gain 5 lb (2.268 kg) Urinalysis: Urine Protein    Urine Glucose    Fetal Status: Fetal Heart Rate (bpm): 152 Fundal Height: 22 cm Movement: Present     General:  Alert, oriented and cooperative. Patient is in no acute distress.  Skin: Skin is warm and dry. No rash noted.   Cardiovascular: Normal heart rate noted  Respiratory: Normal respiratory effort, no problems with respiration noted  Abdomen: Soft, gravid, appropriate for gestational age. Pain/Pressure: Present, small lump palpable when lying or standing superior and to left of umbilicus, mildly tender to palpation, no skin changes, warmth, erythema     Pelvic:  Cervical exam deferred         Extremities: Normal range of motion.     Mental Status: Normal mood and affect. Normal behavior. Normal judgment and thought content.   Assessment   24 y.o. G2P1001 at [redacted]w[redacted]d by  07/27/2020, by Last Menstrual Period presenting for work-in prenatal visit  Plan   Umbilical hernia: decrease level of workout if painful, wear abdominal support band, support tape, return for worsening symptoms, discuss surgical repair after pregnancy as needed  Preterm labor symptoms and general obstetric precautions including but not limited to vaginal bleeding, contractions, leaking of fluid and fetal movement were reviewed in detail with the patient. Please refer to After Visit Summary for other counseling recommendations.   Return for has follow up scheduled.  Tresea Mall, CNM 03/23/2020 8:51 AM

## 2020-04-01 ENCOUNTER — Other Ambulatory Visit: Payer: Self-pay

## 2020-04-01 ENCOUNTER — Ambulatory Visit (INDEPENDENT_AMBULATORY_CARE_PROVIDER_SITE_OTHER): Payer: Medicaid Other | Admitting: Certified Nurse Midwife

## 2020-04-01 VITALS — BP 90/60 | Wt 180.0 lb

## 2020-04-01 DIAGNOSIS — Z3A23 23 weeks gestation of pregnancy: Secondary | ICD-10-CM

## 2020-04-01 DIAGNOSIS — Z131 Encounter for screening for diabetes mellitus: Secondary | ICD-10-CM

## 2020-04-01 DIAGNOSIS — Z113 Encounter for screening for infections with a predominantly sexual mode of transmission: Secondary | ICD-10-CM

## 2020-04-01 DIAGNOSIS — Z3492 Encounter for supervision of normal pregnancy, unspecified, second trimester: Secondary | ICD-10-CM

## 2020-04-01 DIAGNOSIS — M549 Dorsalgia, unspecified: Secondary | ICD-10-CM

## 2020-04-01 DIAGNOSIS — O99891 Other specified diseases and conditions complicating pregnancy: Secondary | ICD-10-CM

## 2020-04-01 LAB — POCT URINALYSIS DIPSTICK OB
Bilirubin, UA: NEGATIVE
Blood, UA: NEGATIVE
Glucose, UA: NEGATIVE
Ketones, UA: NEGATIVE
Nitrite, UA: NEGATIVE
POC,PROTEIN,UA: NEGATIVE
Spec Grav, UA: 1.01 (ref 1.010–1.025)
Urobilinogen, UA: NEGATIVE E.U./dL — AB
pH, UA: >=9 — AB (ref 5.0–8.0)

## 2020-04-01 NOTE — Progress Notes (Signed)
[name]- no concerns 

## 2020-04-02 NOTE — Progress Notes (Signed)
[name] at 23wk2d: Doing well. Complains of back ache extending from sacral area up to scapula. No trauma or injury. No dysuria.  Baby active. Some concerns regarding possible umbilical hernia. Can't see the lump to the left and just above umbilicus as well today.  Exam: FH at 23 cm FHTs WNL Unable to appreciate hernia when standing or lying today as uterus enlarges above the  Back: No CVAT bilaterally. No point tenderness Urine dipstick: Results for orders placed or performed in visit on 04/01/20 (from the past 48 hour(s))  POC Urinalysis Dipstick OB     Status: Abnormal   Collection Time: 04/01/20  8:11 AM  Result Value Ref Range   Color, UA     Clarity, UA     Glucose, UA Negative Negative   Bilirubin, UA neg    Ketones, UA neg    Spec Grav, UA 1.010 1.010 - 1.025   Blood, UA neg    pH, UA >=9.0 (A) 5.0 - 8.0   POC,PROTEIN,UA Negative Negative, Trace, Small (1+), Moderate (2+), Large (3+), 4+   Urobilinogen, UA negative (A) 0.2 or 1.0 E.U./dL   Nitrite, UA neg    Leukocytes, UA Trace (A) Negative   Appearance     Odor      A: IUP at 23wk2d S=D Musculoskeletal back pain  P: [name]/ 28 week labs in 4 weeks Advised that hernia not likely to cause problems during the remainder of the pregnancy Will refer to surgery for evaluation after 6 weeks postpartum. Discussed good body mechanics. Recommend use of massage, Biofreeze, heat for back pain Can see chiropractor if desires/ can refer to PT if desires Urine culture Rebecca Woods, CNM

## 2020-04-03 LAB — URINE CULTURE

## 2020-04-06 ENCOUNTER — Other Ambulatory Visit: Payer: Self-pay | Admitting: Certified Nurse Midwife

## 2020-04-28 ENCOUNTER — Other Ambulatory Visit: Payer: BC Managed Care – PPO

## 2020-04-28 ENCOUNTER — Ambulatory Visit (INDEPENDENT_AMBULATORY_CARE_PROVIDER_SITE_OTHER): Payer: BC Managed Care – PPO | Admitting: Certified Nurse Midwife

## 2020-04-28 ENCOUNTER — Other Ambulatory Visit: Payer: Self-pay

## 2020-04-28 VITALS — BP 110/70 | Ht 66.0 in | Wt 185.0 lb

## 2020-04-28 DIAGNOSIS — Z3492 Encounter for supervision of normal pregnancy, unspecified, second trimester: Secondary | ICD-10-CM

## 2020-04-28 DIAGNOSIS — Z3A27 27 weeks gestation of pregnancy: Secondary | ICD-10-CM

## 2020-04-28 LAB — POCT URINALYSIS DIPSTICK OB
Glucose, UA: NEGATIVE
POC,PROTEIN,UA: NEGATIVE

## 2020-04-28 NOTE — Progress Notes (Signed)
[name] at 27wk1d: Doing well. Back is feeling better. Baby active. Plans on breast and bottle feeding. Breast fed her first baby.  Having 28 week labs today  Exam: BP 110/70, FH 27cm, FHT 136, Weight up 5# to 185 (TWG 11#)  A: IUP at 27.1 weeks S=D Blood type O pos-Rhogam not indicated  P: Breast and bottle feeding [name] in 3 weeks Discussed getting TDAP at next visit  Farrel Conners, CNM

## 2020-04-29 LAB — 28 WEEK RH+PANEL
Basophils Absolute: 0 10*3/uL (ref 0.0–0.2)
Basos: 0 %
EOS (ABSOLUTE): 0.1 10*3/uL (ref 0.0–0.4)
Eos: 1 %
Gestational Diabetes Screen: 63 mg/dL — ABNORMAL LOW (ref 65–139)
HIV Screen 4th Generation wRfx: NONREACTIVE
Hematocrit: 33.9 % — ABNORMAL LOW (ref 34.0–46.6)
Hemoglobin: 11.2 g/dL (ref 11.1–15.9)
Immature Grans (Abs): 0.1 10*3/uL (ref 0.0–0.1)
Immature Granulocytes: 1 %
Lymphocytes Absolute: 1.8 10*3/uL (ref 0.7–3.1)
Lymphs: 17 %
MCH: 28.3 pg (ref 26.6–33.0)
MCHC: 33 g/dL (ref 31.5–35.7)
MCV: 86 fL (ref 79–97)
Monocytes Absolute: 0.5 10*3/uL (ref 0.1–0.9)
Monocytes: 5 %
Neutrophils Absolute: 8.1 10*3/uL — ABNORMAL HIGH (ref 1.4–7.0)
Neutrophils: 76 %
Platelets: 226 10*3/uL (ref 150–450)
RBC: 3.96 x10E6/uL (ref 3.77–5.28)
RDW: 12.5 % (ref 11.7–15.4)
RPR Ser Ql: NONREACTIVE
WBC: 10.7 10*3/uL (ref 3.4–10.8)

## 2020-05-20 ENCOUNTER — Other Ambulatory Visit: Payer: Self-pay

## 2020-05-20 ENCOUNTER — Ambulatory Visit (INDEPENDENT_AMBULATORY_CARE_PROVIDER_SITE_OTHER): Payer: BC Managed Care – PPO | Admitting: Certified Nurse Midwife

## 2020-05-20 VITALS — BP 102/50 | Wt 190.0 lb

## 2020-05-20 DIAGNOSIS — Z3A30 30 weeks gestation of pregnancy: Secondary | ICD-10-CM | POA: Diagnosis not present

## 2020-05-20 DIAGNOSIS — Z23 Encounter for immunization: Secondary | ICD-10-CM | POA: Diagnosis not present

## 2020-05-20 LAB — POCT URINALYSIS DIPSTICK OB
Glucose, UA: NEGATIVE
POC,PROTEIN,UA: NEGATIVE

## 2020-05-20 MED ORDER — BETAMETHASONE VALERATE 0.1 % EX OINT
1.0000 | TOPICAL_OINTMENT | Freq: Two times a day (BID) | CUTANEOUS | 0 refills | Status: DC
Start: 2020-05-20 — End: 2020-06-30

## 2020-05-20 NOTE — Progress Notes (Signed)
C/o rash - left arm.rj

## 2020-05-22 NOTE — Progress Notes (Signed)
[name] at 30wk 2d: doing well. Has pruritic rash on inner left arm. She thinks is poison oak and has applied Calamine lotion and Benadryl lotion without relief of itching.Has not been around poison oak, but does have a dog which may have been the source. Baby active. Taking prenatal vitamins with iron  28 week lab results reviewed. Exam: FH 30cm. FHT 141. Negative proteinuria, Weight up 5# (190#) Fine maculopapular rash on inner left lower arm. Appears dry. No vesicles seen  A: IUP at 30 weeks S=D Contact dermatitis  P: TDAP today Kenalog 0.1% -apply to rash BID [name] in 2 weeks  Farrel Conners, CNM

## 2020-06-08 ENCOUNTER — Other Ambulatory Visit: Payer: Self-pay

## 2020-06-08 ENCOUNTER — Ambulatory Visit (INDEPENDENT_AMBULATORY_CARE_PROVIDER_SITE_OTHER): Payer: Medicaid Other | Admitting: Certified Nurse Midwife

## 2020-06-08 VITALS — BP 102/66 | Wt 194.0 lb

## 2020-06-08 DIAGNOSIS — Z3493 Encounter for supervision of normal pregnancy, unspecified, third trimester: Secondary | ICD-10-CM

## 2020-06-08 DIAGNOSIS — Z3A33 33 weeks gestation of pregnancy: Secondary | ICD-10-CM

## 2020-06-08 DIAGNOSIS — Z3490 Encounter for supervision of normal pregnancy, unspecified, unspecified trimester: Secondary | ICD-10-CM

## 2020-06-08 LAB — POCT URINALYSIS DIPSTICK OB
Glucose, UA: NEGATIVE
POC,PROTEIN,UA: NEGATIVE

## 2020-06-08 NOTE — Progress Notes (Signed)
No concerns.rj 

## 2020-06-08 NOTE — Progress Notes (Signed)
P 

## 2020-06-19 NOTE — Progress Notes (Signed)
[name] at 33 weeks: Doing well. Baby active. No regular contractions, LOF or vaginal bleeding  Exam: FHTs WNL. BP 102/66 Weight up 4#( 194), negative proteinuria  A: IUP at 33 weeks  P: Will be breast and bottle feeding Thinking about Mirena for [facility] 34 week precautions  Farrel Conners, CNM

## 2020-06-21 ENCOUNTER — Other Ambulatory Visit: Payer: Self-pay

## 2020-06-21 ENCOUNTER — Ambulatory Visit: Payer: Medicaid Other

## 2020-06-21 ENCOUNTER — Telehealth: Payer: Self-pay

## 2020-06-21 DIAGNOSIS — R300 Dysuria: Secondary | ICD-10-CM

## 2020-06-21 LAB — POCT URINALYSIS DIPSTICK
Bilirubin, UA: NEGATIVE
Blood, UA: NEGATIVE
Glucose, UA: NEGATIVE
Ketones, UA: NEGATIVE
Nitrite, UA: NEGATIVE
Protein, UA: NEGATIVE
Spec Grav, UA: 1.01 (ref 1.010–1.025)
Urobilinogen, UA: 0.2 E.U./dL
pH, UA: 5 (ref 5.0–8.0)

## 2020-06-21 NOTE — Telephone Encounter (Signed)
Yes and wait on results given UA looks good

## 2020-06-21 NOTE — Telephone Encounter (Signed)
Pt called triage reporting pain when she urinates, her u/a shows leukocytes. Can I order a urine culture under your name? Can you send in meds or do we need to wait for result?please advise

## 2020-06-22 ENCOUNTER — Ambulatory Visit (INDEPENDENT_AMBULATORY_CARE_PROVIDER_SITE_OTHER): Payer: Medicaid Other | Admitting: Obstetrics

## 2020-06-22 VITALS — BP 100/70 | Ht 66.0 in | Wt 199.8 lb

## 2020-06-22 DIAGNOSIS — Z3A35 35 weeks gestation of pregnancy: Secondary | ICD-10-CM

## 2020-06-22 DIAGNOSIS — Z3493 Encounter for supervision of normal pregnancy, unspecified, third trimester: Secondary | ICD-10-CM

## 2020-06-22 DIAGNOSIS — Z3490 Encounter for supervision of normal pregnancy, unspecified, unspecified trimester: Secondary | ICD-10-CM

## 2020-06-22 LAB — POCT URINALYSIS DIPSTICK OB
Glucose, UA: NEGATIVE
POC,PROTEIN,UA: NEGATIVE

## 2020-06-22 NOTE — Progress Notes (Signed)
  Routine Prenatal Care Visit  Subjective  Rebecca Woods is a 24 y.o. G2P1001 at [redacted]w[redacted]d being seen today for ongoing prenatal care.  She is currently monitored for the following issues for this low-risk pregnancy and has Furuncle of groin; Dysuria; Encounter for supervision of low-risk pregnancy, antepartum; and Umbilical hernia on their problem list.  ----------------------------------------------------------------------------------- Patient reports backache, no bleeding, no leaking and occasional contractions.  She is asking for  A cervical exam as she has been feeling some pelvic pressure and occasional contractions.  . Vag. Bleeding: None.  Movement: Present. Leaking Fluid denies.  ----------------------------------------------------------------------------------- The following portions of the patient's history were reviewed and updated as appropriate: allergies, current medications, past family history, past medical history, past social history, past surgical history and problem list. Problem list updated.  Objective  Blood pressure 100/70, height 5\' 6"  (1.676 m), weight 199 lb 12.8 oz (90.6 kg), last menstrual period 10/21/2019. Pregravid weight 174 lb (78.9 kg) Total Weight Gain 25 lb 12.8 oz (11.7 kg) Urinalysis: Urine Protein    Urine Glucose    Fetal Status:     Movement: Present     General:  Alert, oriented and cooperative. Patient is in no acute distress.  Skin: Skin is warm and dry. No rash noted.   Cardiovascular: Normal heart rate noted  Respiratory: Normal respiratory effort, no problems with respiration noted  Abdomen: Soft, gravid, appropriate for gestational age. Pain/Pressure: Present     Pelvic:  Cervical exam performed      1 cm/thick/ -3 cervx is posterior and soft.  Extremities: Normal range of motion.     Mental Status: Normal mood and affect. Normal behavior. Normal judgment and thought content.   Assessment   24 y.o. G2P1001 at [redacted]w[redacted]d by  07/27/2020, by Last  Menstrual Period presenting for routine prenatal visit  Plan   SECOND Problems (from 12/17/19 to present)    Problem Noted Resolved   Umbilical hernia 03/23/2020 by 03/25/2020, CNM No       Preterm labor symptoms and general obstetric precautions including but not limited to vaginal bleeding, contractions, leaking of fluid and fetal movement were reviewed in detail with the patient. Please refer to After Visit Summary for other counseling recommendations.  Discussed her cervical dilation- she will be on pelvic rest until 36.5 to 37 weeks.  Return in about 1 week (around 06/29/2020) for return OB and GBS.  08/29/2020, CNM  06/22/2020 11:50 AM

## 2020-06-22 NOTE — Addendum Note (Signed)
Addended by: Clement Husbands A on: 06/22/2020 02:19 PM   Modules accepted: Orders

## 2020-06-23 LAB — URINE CULTURE

## 2020-06-28 ENCOUNTER — Other Ambulatory Visit: Payer: Self-pay

## 2020-06-28 ENCOUNTER — Ambulatory Visit (INDEPENDENT_AMBULATORY_CARE_PROVIDER_SITE_OTHER): Payer: Medicaid Other | Admitting: Obstetrics

## 2020-06-28 VITALS — BP 116/70 | Wt 199.8 lb

## 2020-06-28 DIAGNOSIS — Z3A35 35 weeks gestation of pregnancy: Secondary | ICD-10-CM

## 2020-06-28 DIAGNOSIS — Z3490 Encounter for supervision of normal pregnancy, unspecified, unspecified trimester: Secondary | ICD-10-CM

## 2020-06-28 DIAGNOSIS — Z3493 Encounter for supervision of normal pregnancy, unspecified, third trimester: Secondary | ICD-10-CM

## 2020-06-28 NOTE — Progress Notes (Signed)
  Routine Prenatal Care Visit  Subjective  Rebecca Woods is a 24 y.o. G2P1001 at [redacted]w[redacted]d being seen today for ongoing prenatal care.  She is currently monitored for the following issues for this low-risk pregnancy and has Furuncle of groin; Dysuria; Encounter for supervision of low-risk pregnancy, antepartum; and Umbilical hernia on their problem list.  ----------------------------------------------------------------------------------- Patient reports no leaking and occasional contractions.  Her baby has been quieter today. She passed her mucous plug today. Contractions: Irregular. Vag. Bleeding: Scant.  Movement: Present. Leaking Fluid denies.  ----------------------------------------------------------------------------------- The following portions of the patient's history were reviewed and updated as appropriate: allergies, current medications, past family history, past medical history, past social history, past surgical history and problem list. Problem list updated.  Objective  Blood pressure 116/70, weight 199 lb 12.8 oz (90.6 kg), last menstrual period 10/21/2019. Pregravid weight 174 lb (78.9 kg) Total Weight Gain 25 lb 12.8 oz (11.7 kg) Urinalysis: Urine Protein    Urine Glucose    Fetal Status:     Movement: Present     General:  Alert, oriented and cooperative. Patient is in no acute distress.  Skin: Skin is warm and dry. No rash noted.   Cardiovascular: Normal heart rate noted  Respiratory: Normal respiratory effort, no problems with respiration noted  Abdomen: Soft, gravid, appropriate for gestational age. Pain/Pressure: Present     Pelvic:  Cervical exam performed      2cms/thick/ballotable soft and posterior  Extremities: Normal range of motion.     Mental Status: Normal mood and affect. Normal behavior. Normal judgment and thought content.   Assessment   24 y.o. G2P1001 at [redacted]w[redacted]d by  07/27/2020, by Last Menstrual Period presenting for routine prenatal visit  Plan   SECOND  Problems (from 12/17/19 to present)    Problem Noted Resolved   Umbilical hernia 03/23/2020 by Tresea Mall, CNM No       Preterm labor symptoms and general obstetric precautions including but not limited to vaginal bleeding, contractions, leaking of fluid and fetal movement were reviewed in detail with the patient. Please refer to After Visit Summary for other counseling recommendations.  Labor precautions reviewed. Pelvic rest this week until she is 37 weeks.  Return in about 1 week (around 07/05/2020) for return OB.  Mirna Mires, CNM  06/28/2020 4:58 PM

## 2020-06-29 ENCOUNTER — Other Ambulatory Visit: Payer: Self-pay | Admitting: Obstetrics

## 2020-06-29 DIAGNOSIS — Z3490 Encounter for supervision of normal pregnancy, unspecified, unspecified trimester: Secondary | ICD-10-CM

## 2020-06-29 DIAGNOSIS — Z3A36 36 weeks gestation of pregnancy: Secondary | ICD-10-CM

## 2020-06-29 NOTE — Addendum Note (Signed)
Addended by: Mirna Mires on: 06/29/2020 11:06 AM   Modules accepted: Orders

## 2020-06-29 NOTE — Addendum Note (Signed)
Addended by: Mirna Mires on: 06/29/2020 09:28 AM   Modules accepted: Orders

## 2020-06-30 ENCOUNTER — Other Ambulatory Visit: Payer: Self-pay

## 2020-06-30 ENCOUNTER — Telehealth: Payer: Self-pay

## 2020-06-30 ENCOUNTER — Encounter: Payer: Self-pay | Admitting: Obstetrics & Gynecology

## 2020-06-30 ENCOUNTER — Observation Stay (HOSPITAL_BASED_OUTPATIENT_CLINIC_OR_DEPARTMENT_OTHER)
Admission: EM | Admit: 2020-06-30 | Discharge: 2020-06-30 | Disposition: A | Discharge status: Home / Self Care | Payer: BC Managed Care – PPO | Source: Home / Self Care | Admitting: Advanced Practice Midwife

## 2020-06-30 DIAGNOSIS — Z3A36 36 weeks gestation of pregnancy: Secondary | ICD-10-CM | POA: Insufficient documentation

## 2020-06-30 DIAGNOSIS — R1011 Right upper quadrant pain: Secondary | ICD-10-CM

## 2020-06-30 DIAGNOSIS — K429 Umbilical hernia without obstruction or gangrene: Secondary | ICD-10-CM

## 2020-06-30 DIAGNOSIS — O26893 Other specified pregnancy related conditions, third trimester: Secondary | ICD-10-CM | POA: Diagnosis present

## 2020-06-30 DIAGNOSIS — R109 Unspecified abdominal pain: Secondary | ICD-10-CM | POA: Diagnosis present

## 2020-06-30 DIAGNOSIS — O99893 Other specified diseases and conditions complicating puerperium: Secondary | ICD-10-CM | POA: Insufficient documentation

## 2020-06-30 DIAGNOSIS — O99891 Other specified diseases and conditions complicating pregnancy: Secondary | ICD-10-CM

## 2020-06-30 DIAGNOSIS — M549 Dorsalgia, unspecified: Secondary | ICD-10-CM

## 2020-06-30 NOTE — Telephone Encounter (Signed)
Send to L&D

## 2020-06-30 NOTE — Telephone Encounter (Signed)
Pt aware to go to L&D via ED; Mary notified.

## 2020-06-30 NOTE — OB Triage Note (Signed)
Pt. Discharged home in stable condition with family. Discharge instructions and comfort measures reviewed; pt. verbalized understanding.

## 2020-06-30 NOTE — Telephone Encounter (Signed)
Pt calling; 36wks; having right upper abdominal pain.  [phone]  Pain is at the bottom of the ribs; back pain; losing more mucus but with clots.

## 2020-06-30 NOTE — Discharge Instructions (Signed)
Back pain: heat/ice, abdominal support band, stretches/exercise, massage, epsom salt soaks, tylenol Abdominal pain: position changes, epsom salt soaks, tylenol

## 2020-06-30 NOTE — OB Triage Note (Signed)
Pt. Presents to L/D triage for RUQ pain that began yesterday at 1800. She describes the pain as constant soreness, rated 8/10. No LOF and positive fetal movement stated. She also describes rectal pressure. Last BM today and last intercourse a month prior. She has noticed some pink-tinged discharge. Cervix was checked 06/28/20. Monitors applied and assessing. VSS.

## 2020-06-30 NOTE — Discharge Summary (Signed)
Physician Final Progress Note  Patient ID: Rebecca Woods MRN: [id number] DOB/AGE: [birth year] 24 y.o.  Admit date: 06/30/2020 Admitting provider: Tresea Mall, CNM Discharge date: 06/30/2020   Admission Diagnoses: upper right quadrant pain, back pain  Discharge Diagnoses:  Active Problems:   Abdominal pain during pregnancy in third trimester   Back pain affecting pregnancy in third trimester   [redacted] weeks gestation of pregnancy Possible mild gallbladder inflammation vs fetal position  History of Present Illness: The patient is a 24 y.o. female G2P1001 at [redacted]w[redacted]d who presents for upper right quadrant pain that began last night at 6 PM. The pain is constant and described as sore. She also reports ongoing low back pain. She had been referred for chiropractic earlier in the pregnancy although never followed up on that care.   The patient was admitted for observation, placed on monitors, cervical exam. We discussed possible reasons for upper right quadrant pain including mild gall bladder inflammation vs fetal position. Monitoring is reassuring, vital signs all normal. She is discharged to home with instructions, precautions, comfort measures.   Past Medical History:  Diagnosis Date   Acute vaginitis 09/26/2019   Anemia    H/O   Bacterial vaginitis 08/02/2019   Dyspareunia due to medical condition in female 05/24/2016   GERD (gastroesophageal reflux disease)    NO MEDS    Past Surgical History:  Procedure Laterality Date   LAPAROSCOPY N/A 05/24/2016   Procedure: LAPAROSCOPY DIAGNOSTIC;  Surgeon: Nadara Mustard, MD;  Location: [facility] ORS;  Service: Gynecology;  Laterality: N/A;   OVARIAN CYST SURGERY  2017    No current facility-administered medications on file prior to encounter.   Current Outpatient Medications on File Prior to Encounter  Medication Sig Dispense Refill   Prenatal MV-Min-FA-Omega-3 (PRENATAL GUMMIES/DHA & FA) 0.4-32.5 MG CHEW Chew 1 tablet by mouth daily.      betamethasone valerate ointment (VALISONE) 0.1 % Apply 1 application topically 2 (two) times daily. (Patient not taking: Reported on 06/08/2020) 30 g 0    No Known Allergies  Social History   Socioeconomic History   Marital status: Single    Spouse name: Not on file   Number of children: 1   Years of education: Not on file   Highest education level: High school graduate  Occupational History   Occupation: ADMIN    Comment: [employer]  Tobacco Use   Smoking status: Never Smoker   Smokeless tobacco: Never Used  Building services engineer Use: Never used  Substance and Sexual Activity   Alcohol use: Not Currently   Drug use: Not Currently    Types: Marijuana   Sexual activity: Yes    Birth control/protection: None    Comment: mirena-planning  Other Topics Concern   Not on file  Social History Narrative   Not on file   Social Determinants of Health   Financial Resource Strain:    Difficulty of Paying Living Expenses: Not on file  Food Insecurity:    Worried About Programme researcher, broadcasting/film/video in the Last Year: Not on file   [employer] of Food in the Last Year: Not on file  Transportation Needs:    Lack of Transportation (Medical): Not on file   Lack of Transportation (Non-Medical): Not on file  Physical Activity:    Days of Exercise per Week: Not on file   Minutes of Exercise per Session: Not on file  Stress:    Feeling of Stress : Not on file  Social Connections:  Frequency of Communication with Friends and Family: Not on file   Frequency of Social Gatherings with Friends and Family: Not on file   Attends Religious Services: Not on file   Active Member of Clubs or Organizations: Not on file   Attends Banker Meetings: Not on file   Marital Status: Not on file  Intimate Partner Violence:    Fear of Current or Ex-Partner: Not on file   Emotionally Abused: Not on file   Physically Abused: Not on file   Sexually Abused: Not on file    Family  History  Problem Relation Age of Onset   Diabetes Mother    Cancer Paternal Grandfather        STOMACH OR COLON NOT SURE   Thyroid disease Neg Hx      Review of Systems  Constitutional: Negative for chills and fever.  HENT: Negative for congestion, ear discharge, ear pain, hearing loss, sinus pain and sore throat.   Eyes: Negative for blurred vision and double vision.  Respiratory: Negative for cough, shortness of breath and wheezing.   Cardiovascular: Negative for chest pain, palpitations and leg swelling.  Gastrointestinal: Positive for abdominal pain. Negative for blood in stool, constipation, diarrhea, heartburn, melena, nausea and vomiting.  Genitourinary: Negative for dysuria, flank pain, frequency, hematuria and urgency.  Musculoskeletal: Positive for back pain. Negative for joint pain and myalgias.  Skin: Negative for itching and rash.  Neurological: Negative for dizziness, tingling, tremors, sensory change, speech change, focal weakness, seizures, loss of consciousness, weakness and headaches.  Endo/Heme/Allergies: Negative for environmental allergies. Does not bruise/bleed easily.  Psychiatric/Behavioral: Negative for depression, hallucinations, memory loss, substance abuse and suicidal ideas. The patient is not nervous/anxious and does not have insomnia.      Physical Exam: BP 115/70 (BP Location: Left Arm)    Pulse 100    Temp 98.3 F (36.8 C) (Oral)    Resp 20    Ht 5\' 6"  (1.676 m)    Wt 90.3 kg    LMP 10/21/2019 (Exact Date) Comment: normal   BMI 32.12 kg/m   Constitutional: Well nourished, well developed female in no acute distress.  HEENT: normal Skin: Warm and dry.  Cardiovascular: Regular rate and rhythm.   Extremity: no edema  Respiratory: Clear to auscultation bilateral. Normal respiratory effort Abdomen: FHT present Back: no CVAT Neuro: DTRs 2+, Cranial nerves grossly intact Psych: Alert and Oriented x3. No memory deficits. Normal mood and affect.  MS:  normal gait, normal bilateral lower extremity ROM/strength/stability.  Pelvic exam: (female chaperone present) is not limited by body habitus EGBUS: within normal limits Vagina: within normal limits and with normal mucosa, scant blood in the vault Cervix: 2/40/-3  Toco: q 10-15 minutes Fetal well being: 135 bpm, moderate variability, +accelerartions, -decelerations   Consults: None  Significant Findings/ Diagnostic Studies: none  Procedures: NST  Hospital Course: The patient was admitted to Labor and Delivery Triage for observation.   Discharge Condition: good  Disposition: Discharge disposition: 01-Home or Self Care  Diet: Regular diet  Discharge Activity: Activity as tolerated  Discharge Instructions    Discharge activity:  No Restrictions   Complete by: As directed    Discharge diet:  No restrictions   Complete by: As directed    Fetal Kick Count:  Lie on our left side for one hour after a meal, and count the number of times your baby kicks.  If it is less than 5 times, get up, move around and drink some  juice.  Repeat the test 30 minutes later.  If it is still less than 5 kicks in an hour, notify your doctor.   Complete by: As directed    No sexual activity restrictions   Complete by: As directed    Notify physician for a general feeling that "something is not right"   Complete by: As directed    Notify physician for increase or change in vaginal discharge   Complete by: As directed    Notify physician for intestinal cramps, with or without diarrhea, sometimes described as "gas pain"   Complete by: As directed    Notify physician for leaking of fluid   Complete by: As directed    Notify physician for low, dull backache, unrelieved by heat or Tylenol   Complete by: As directed    Notify physician for menstrual like cramps   Complete by: As directed    Notify physician for pelvic pressure   Complete by: As directed    Notify physician for uterine contractions.   These may be painless and feel like the uterus is tightening or the baby is  "balling up"   Complete by: As directed    Notify physician for vaginal bleeding   Complete by: As directed    PRETERM LABOR:  Includes any of the follwing symptoms that occur between 20 - [redacted] weeks gestation.  If these symptoms are not stopped, preterm labor can result in preterm delivery, placing your baby at risk   Complete by: As directed      Allergies as of 06/30/2020   No Known Allergies     Medication List    STOP taking these medications   betamethasone valerate ointment 0.1 % Commonly known as: VALISONE     TAKE these medications   Prenatal Gummies/DHA & FA 0.4-32.5 MG Chew Chew 1 tablet by mouth daily.       Follow-up Information    [facility]. Go to.   Specialty: Obstetrics and Gynecology Why: scheduled prenatal appointment Contact information: [address] [place] [address] [phone]              Total time spent taking care of this patient: 20 minutes  Signed: Tresea Mall, CNM  06/30/2020, 12:25 PM

## 2020-07-01 ENCOUNTER — Other Ambulatory Visit: Payer: Self-pay

## 2020-07-01 ENCOUNTER — Telehealth: Payer: Self-pay

## 2020-07-01 ENCOUNTER — Inpatient Hospital Stay
Admission: EM | Admit: 2020-07-01 | Discharge: 2020-07-02 | DRG: 805 | Disposition: A | Discharge status: Home / Self Care | Payer: BC Managed Care – PPO | Attending: Obstetrics and Gynecology | Admitting: Obstetrics and Gynecology

## 2020-07-01 ENCOUNTER — Encounter: Payer: Self-pay | Admitting: Obstetrics and Gynecology

## 2020-07-01 DIAGNOSIS — Z3A36 36 weeks gestation of pregnancy: Secondary | ICD-10-CM | POA: Diagnosis not present

## 2020-07-01 DIAGNOSIS — O99891 Other specified diseases and conditions complicating pregnancy: Secondary | ICD-10-CM

## 2020-07-01 DIAGNOSIS — K429 Umbilical hernia without obstruction or gangrene: Secondary | ICD-10-CM

## 2020-07-01 DIAGNOSIS — Z20822 Contact with and (suspected) exposure to covid-19: Secondary | ICD-10-CM | POA: Diagnosis present

## 2020-07-01 DIAGNOSIS — O42913 Preterm premature rupture of membranes, unspecified as to length of time between rupture and onset of labor, third trimester: Principal | ICD-10-CM | POA: Diagnosis present

## 2020-07-01 DIAGNOSIS — O26893 Other specified pregnancy related conditions, third trimester: Secondary | ICD-10-CM | POA: Diagnosis present

## 2020-07-01 DIAGNOSIS — O4703 False labor before 37 completed weeks of gestation, third trimester: Secondary | ICD-10-CM | POA: Diagnosis present

## 2020-07-01 DIAGNOSIS — O9962 Diseases of the digestive system complicating childbirth: Secondary | ICD-10-CM | POA: Diagnosis present

## 2020-07-01 LAB — CBC
HCT: 36.6 % (ref 36.0–46.0)
Hemoglobin: 12.1 g/dL (ref 12.0–15.0)
MCH: 26.8 pg (ref 26.0–34.0)
MCHC: 33.1 g/dL (ref 30.0–36.0)
MCV: 81 fL (ref 80.0–100.0)
Platelets: 260 10*3/uL (ref 150–400)
RBC: 4.52 MIL/uL (ref 3.87–5.11)
RDW: 13.8 % (ref 11.5–15.5)
WBC: 14.7 10*3/uL — ABNORMAL HIGH (ref 4.0–10.5)
nRBC: 0 % (ref 0.0–0.2)

## 2020-07-01 LAB — URINALYSIS, COMPLETE (UACMP) WITH MICROSCOPIC
Bacteria, UA: NONE SEEN
Bilirubin Urine: NEGATIVE
Glucose, UA: NEGATIVE mg/dL
Hgb urine dipstick: NEGATIVE
Ketones, ur: NEGATIVE mg/dL
Leukocytes,Ua: NEGATIVE
Nitrite: NEGATIVE
Protein, ur: NEGATIVE mg/dL
Specific Gravity, Urine: 1.019 (ref 1.005–1.030)
pH: 7 (ref 5.0–8.0)

## 2020-07-01 LAB — TYPE AND SCREEN
ABO/RH(D): O POS
Antibody Screen: NEGATIVE

## 2020-07-01 LAB — GC/CHLAMYDIA PROBE AMP
Chlamydia trachomatis, NAA: NEGATIVE
Neisseria Gonorrhoeae by PCR: NEGATIVE

## 2020-07-01 LAB — GROUP B STREP BY PCR: Group B strep by PCR: NEGATIVE

## 2020-07-01 LAB — RUPTURE OF MEMBRANE (ROM)PLUS: Rom Plus: POSITIVE

## 2020-07-01 LAB — SARS CORONAVIRUS 2 BY RT PCR (HOSPITAL ORDER, PERFORMED IN ~~LOC~~ HOSPITAL LAB): SARS Coronavirus 2: NEGATIVE

## 2020-07-01 MED ORDER — COCONUT OIL OIL
1.0000 "application " | TOPICAL_OIL | Status: DC | PRN
  Administered 2020-07-02: 1 via TOPICAL
  Filled 2020-07-01: qty 120

## 2020-07-01 MED ORDER — ACETAMINOPHEN 325 MG PO TABS: 650.0000 mg | ORAL_TABLET | ORAL | Status: DC | PRN

## 2020-07-01 MED ORDER — OXYTOCIN-SODIUM CHLORIDE 30-0.9 UT/500ML-% IV SOLN
2.5000 [IU]/h | INTRAVENOUS | Status: DC
  Administered 2020-07-01: 2.5 [IU]/h via INTRAVENOUS

## 2020-07-01 MED ORDER — ONDANSETRON HCL 4 MG/2ML IJ SOLN: 4.0000 mg | Freq: Four times a day (QID) | INTRAMUSCULAR | Status: DC | PRN

## 2020-07-01 MED ORDER — MISOPROSTOL 200 MCG PO TABS
ORAL_TABLET | ORAL | Status: AC
  Filled 2020-07-01: qty 4

## 2020-07-01 MED ORDER — ACETAMINOPHEN 325 MG PO TABS
650.0000 mg | ORAL_TABLET | ORAL | Status: DC | PRN
  Administered 2020-07-01 – 2020-07-02 (×2): 650 mg via ORAL
  Filled 2020-07-01 (×2): qty 2

## 2020-07-01 MED ORDER — PRENATAL MULTIVITAMIN CH
1.0000 | ORAL_TABLET | Freq: Every day | ORAL | Status: DC
  Administered 2020-07-02: 1 via ORAL
  Filled 2020-07-01: qty 1

## 2020-07-01 MED ORDER — AMMONIA AROMATIC IN INHA
RESPIRATORY_TRACT | Status: AC
  Filled 2020-07-01: qty 10

## 2020-07-01 MED ORDER — OXYTOCIN-SODIUM CHLORIDE 30-0.9 UT/500ML-% IV SOLN
INTRAVENOUS | Status: AC
  Filled 2020-07-01: qty 1000

## 2020-07-01 MED ORDER — ACETAMINOPHEN 325 MG PO TABS
ORAL_TABLET | ORAL | Status: AC
  Administered 2020-07-01: 650 mg via ORAL
  Filled 2020-07-01: qty 2

## 2020-07-01 MED ORDER — ONDANSETRON HCL 4 MG PO TABS
4.0000 mg | ORAL_TABLET | ORAL | Status: DC | PRN
  Filled 2020-07-01: qty 1

## 2020-07-01 MED ORDER — WITCH HAZEL-GLYCERIN EX PADS: 1.0000 "application " | MEDICATED_PAD | CUTANEOUS | Status: DC | PRN

## 2020-07-01 MED ORDER — IBUPROFEN 600 MG PO TABS
600.0000 mg | ORAL_TABLET | Freq: Four times a day (QID) | ORAL | Status: DC
  Administered 2020-07-01 – 2020-07-02 (×3): 600 mg via ORAL
  Filled 2020-07-01 (×2): qty 1

## 2020-07-01 MED ORDER — OXYTOCIN 10 UNIT/ML IJ SOLN
INTRAMUSCULAR | Status: AC
  Filled 2020-07-01: qty 2

## 2020-07-01 MED ORDER — LIDOCAINE HCL (PF) 1 % IJ SOLN: 30.0000 mL | INTRAMUSCULAR | Status: DC | PRN

## 2020-07-01 MED ORDER — DIPHENHYDRAMINE HCL 25 MG PO CAPS: 25.0000 mg | ORAL_CAPSULE | Freq: Four times a day (QID) | ORAL | Status: DC | PRN

## 2020-07-01 MED ORDER — OXYTOCIN BOLUS FROM INFUSION
333.0000 mL | Freq: Once | INTRAVENOUS | Status: AC
  Administered 2020-07-01: 333 mL via INTRAVENOUS

## 2020-07-01 MED ORDER — ONDANSETRON HCL 4 MG/2ML IJ SOLN: 4.0000 mg | INTRAMUSCULAR | Status: DC | PRN

## 2020-07-01 MED ORDER — LACTATED RINGERS IV SOLN: INTRAVENOUS | Status: DC

## 2020-07-01 MED ORDER — SENNOSIDES-DOCUSATE SODIUM 8.6-50 MG PO TABS
2.0000 | ORAL_TABLET | ORAL | Status: DC
  Administered 2020-07-01: 2 via ORAL
  Filled 2020-07-01: qty 2

## 2020-07-01 MED ORDER — LIDOCAINE HCL (PF) 1 % IJ SOLN
INTRAMUSCULAR | Status: AC
  Filled 2020-07-01: qty 30

## 2020-07-01 MED ORDER — SIMETHICONE 80 MG PO CHEW: 80.0000 mg | CHEWABLE_TABLET | ORAL | Status: DC | PRN

## 2020-07-01 MED ORDER — BUTORPHANOL TARTRATE 1 MG/ML IJ SOLN: 1.0000 mg | INTRAMUSCULAR | Status: DC | PRN

## 2020-07-01 MED ORDER — LACTATED RINGERS IV SOLN: 500.0000 mL | INTRAVENOUS | Status: DC | PRN

## 2020-07-01 MED ORDER — IBUPROFEN 600 MG PO TABS
ORAL_TABLET | ORAL | Status: AC
  Administered 2020-07-01: 600 mg via ORAL
  Filled 2020-07-01: qty 1

## 2020-07-01 MED ORDER — DIBUCAINE (PERIANAL) 1 % EX OINT: 1.0000 "application " | TOPICAL_OINTMENT | CUTANEOUS | Status: DC | PRN

## 2020-07-01 MED ORDER — BENZOCAINE-MENTHOL 20-0.5 % EX AERO: 1.0000 "application " | INHALATION_SPRAY | CUTANEOUS | Status: DC | PRN

## 2020-07-01 NOTE — H&P (Signed)
OB History & Physical   History of Present Illness:  Chief Complaint: contractions, leakage of fluid  HPI:  Rebecca Woods is a 24 y.o. G71P1102 female at [redacted]w[redacted]d dated by LMP.  Her pregnancy has been complicated by umbilical hernia.    She reports contractions.   She reports leakage of fluid.   She denies vaginal bleeding.   She reports fetal movement.    Total weight gain for pregnancy: 11.3 kg   Obstetrical Problem List: SECOND Problems (from 12/17/19 to present)    Problem Noted Resolved   Back pain affecting pregnancy in third trimester 06/30/2020 by Tresea Mall, CNM No   Umbilical hernia 03/23/2020 by Tresea Mall, CNM No       Maternal Medical History:   Past Medical History:  Diagnosis Date  . Acute vaginitis 09/26/2019  . Anemia    H/O  . Bacterial vaginitis 08/02/2019  . Dyspareunia due to medical condition in female 05/24/2016  . GERD (gastroesophageal reflux disease)    NO MEDS    Past Surgical History:  Procedure Laterality Date  . LAPAROSCOPY N/A 05/24/2016   Procedure: LAPAROSCOPY DIAGNOSTIC;  Surgeon: Nadara Mustard, MD;  Location: [facility] ORS;  Service: Gynecology;  Laterality: N/A;  . OVARIAN CYST SURGERY  2017    No Known Allergies  Prior to Admission medications   Medication Sig Start Date End Date Taking? Authorizing Provider  Prenatal MV-Min-FA-Omega-3 (PRENATAL GUMMIES/DHA & FA) 0.4-32.5 MG CHEW Chew 1 tablet by mouth daily.   Yes [provider]    OB History  Gravida Para Term Preterm AB Living  2 2 1 1  0 2  SAB TAB Ectopic Multiple Live Births  0 0 0 0 2    # Outcome Date GA Lbr Len/2nd Weight Sex Delivery Anes PTL Lv  2 Preterm 07/01/20 [redacted]w[redacted]d 12:02 / 00:10  M Vag-Spont None  LIV  1 Term 08/17/13 [redacted]w[redacted]d  3232 g F Vag-Spont  N LIV    Prenatal care site: [facility]  Social History: She  reports that she has never smoked. She has never used smokeless tobacco. She reports previous alcohol use. She reports previous drug use. Drug:  Marijuana.  Family History: family history includes Cancer in her paternal grandfather; Diabetes in her mother.    Review of Systems:  Review of Systems  Constitutional: Negative for chills and fever.  HENT: Negative for congestion, ear discharge, ear pain, hearing loss, sinus pain and sore throat.   Eyes: Negative for blurred vision and double vision.  Respiratory: Negative for cough, shortness of breath and wheezing.   Cardiovascular: Negative for chest pain, palpitations and leg swelling.  Gastrointestinal: Positive for abdominal pain. Negative for blood in stool, constipation, diarrhea, heartburn, melena, nausea and vomiting.  Genitourinary: Negative for dysuria, flank pain, frequency, hematuria and urgency.       Positive for leakage of fluid  Musculoskeletal: Negative for back pain, joint pain and myalgias.  Skin: Negative for itching and rash.  Neurological: Negative for dizziness, tingling, tremors, sensory change, speech change, focal weakness, seizures, loss of consciousness, weakness and headaches.  Endo/Heme/Allergies: Negative for environmental allergies. Does not bruise/bleed easily.  Psychiatric/Behavioral: Negative for depression, hallucinations, memory loss, substance abuse and suicidal ideas. The patient is not nervous/anxious and does not have insomnia.      Physical Exam:  BP 120/74   Pulse 85   Temp 98.1 F (36.7 C) (Oral)   Resp 18   Ht 5\' 7"  (1.702 m)   Wt 90.3 kg  LMP 10/21/2019 (Exact Date) Comment: normal  BMI 31.17 kg/m   Constitutional: Well nourished, well developed female in no acute distress.  HEENT: normal Skin: Warm and dry.  Cardiovascular: Regular rate and rhythm.   Extremity: no edema  Respiratory: Clear to auscultation bilateral. Normal respiratory effort Abdomen: FHT present Back: no CVAT Neuro: DTRs 2+, Cranial nerves grossly intact Psych: Alert and Oriented x3. No memory deficits. Normal mood and affect.  MS: normal gait, normal  bilateral lower extremity ROM/strength/stability.  Pelvic exam: per RN 5 cm on arrival in triage  Results for BRISELDA, [employer] (MRN [id number]) as of 07/01/2020 16:41  Ref. Range 07/01/2020 12:23  Rom Plus Unknown POSITIVE    Pertinent Results:  Prenatal Labs Blood type/Rh O positive  Antibody screen negative  Rubella Not immune  Varicella Immune    RPR Non-reactive  HBsAg negative  HIV negative  GC negative  Chlamydia negative  Genetic screening Diploid/female  1 hour GTT 63  3 hour GTT NA  GBS negative on 9/3   Baseline FHR: 145 beats/min   Variability: moderate   Accelerations: present   Decelerations: absent Contractions: present frequency: every 4-6 Overall assessment: reassuring    Lab Results  Component Value Date   SARSCOV2NAA NEGATIVE 07/01/2020    Assessment:  [name] is a 24 y.o. G91P1102 female at [redacted]w[redacted]d with preterm active labor, rupture of membranes.   Plan:  1. Admit to Labor & Delivery  2. CBC, T&S, Clrs, IVF 3. GBS negative.   4. Fetal well-being: Category I 5. Expectant management for vaginal delivery  Tresea Mall, [facility] 07/01/2020 4:35 PM

## 2020-07-01 NOTE — Telephone Encounter (Signed)
Pt calling; 36w; ctxs since 4:30am; 5-5min apart; poss leaking fluid; good FM.  Adv to go to L&D via ED; allowed one person to be with her.  Latisha notified.

## 2020-07-01 NOTE — Discharge Summary (Signed)
OB Discharge Summary     Patient Name: Rebecca Woods DOB: 1996-04-06 MRN: [id number]  Date of admission: 07/01/2020 Delivering provider: Tresea Mall, CNM  Date of Delivery: 07/01/2020  Date of discharge: 07/02/2020  Admitting diagnosis: Preterm uterine contractions, antepartum, third trimester [O47.03] Preterm premature rupture of membranes in third trimester [O42.913] Intrauterine pregnancy: [redacted]w[redacted]d     Secondary diagnosis: None     Discharge diagnosis:  Late Preterm Pregnancy Delivered                                                                                                Post partum procedures: none  Augmentation: N/A  Complications: None  Hospital course:  Onset of Labor With Vaginal Delivery      24 y.o. yo D5H2992 at [redacted]w[redacted]d was admitted in Active Labor on 07/01/2020.  Patient had an uncomplicated labor course as follows:  Membrane Rupture Time/Date: 4:02 PM ,07/01/2020   Delivery Method:Vaginal, Spontaneous  Episiotomy: None  Lacerations:  None   See delivery note for details  Patient had an uncomplicated postpartum course.    Patient is discharged home in stable condition on 07/02/20.  Newborn Data: Birth date:07/01/2020  Birth time:4:12 PM  Gender:Female  Noel Living status:Living  Apgars:9 ,9  Weight:2900 g     Physical exam  Vitals:   07/02/20 0102 07/02/20 0208 07/02/20 0356 07/02/20 0817  BP: 113/70 (!) 95/53 94/61 102/67  Pulse: (!) 59 67 72 65  Resp: 20 18 18 18   Temp: 97.7 F (36.5 C) 98.3 F (36.8 C) 97.8 F (36.6 C) 97.8 F (36.6 C)  TempSrc: Oral Oral Oral Oral  SpO2: 96% 99% 97% 98%  Weight:      Height:       General: alert, cooperative and no distress Lochia: appropriate Uterine Fundus: firm Incision: N/A DVT Evaluation: No evidence of DVT seen on physical exam.  Labs: Lab Results  Component Value Date   WBC 13.6 (H) 07/02/2020   HGB 9.8 (L) 07/02/2020   HCT 29.7 (L) 07/02/2020   MCV 80.9 07/02/2020   PLT 208 07/02/2020     Discharge instruction: per After Visit Summary.  Medications:  Allergies as of 07/02/2020   No Known Allergies     Medication List    TAKE these medications   ibuprofen 600 MG tablet Commonly known as: ADVIL Take 1 tablet (600 mg total) by mouth every 6 (six) hours.   Prenatal Gummies/DHA & FA 0.4-32.5 MG Chew Chew 1 tablet by mouth daily.       Diet: routine diet  Activity: Advance as tolerated. Pelvic rest for 6 weeks.   Outpatient follow up:  Follow-up Information    11-02-2000, CNM. Schedule an appointment as soon as possible for a visit in 6 week(s).   Specialty: Obstetrics Why: postpartum follow up visit and Mirena IUD insertion Contact information: [address] [place] [phone]                 Postpartum contraception: IUD Mirena Rhogam Given postpartum: NA Rubella vaccine given postpartum: yes Varicella vaccine given postpartum: no TDaP given antepartum  or postpartum: given antepartum    Newborn Delivery   Birth date/time: 07/01/2020 16:12:00 Delivery type: Vaginal, Spontaneous       Baby Feeding: Breast and Formula  Disposition:home with mother  SIGNED:  Vena Austria, MD 07/02/2020 10:49 AM

## 2020-07-01 NOTE — OB Triage Note (Signed)
Ctx since 0400 this am. Reports LOF, denies gush of fluid. Not having to wear a pad. Denies vaginal bleeding. Reports "good" fetal movement. Elaina Hoops

## 2020-07-01 NOTE — Lactation Note (Signed)
This note was copied from a baby's chart. Lactation Consultation Note  Patient Name: Gangi Boy Kaylia DSKAJ'G Date: 07/01/2020 Reason for consult: Initial assessment;1st time breastfeeding;Late-preterm 34-36.6wks   Maternal Data Does the patient have breastfeeding experience prior to this delivery?: Yes Nursed first child x 1 yr Feeding Feeding Type: Breast Fed Mom reports baby latched easily and nursed well little stimulation  LATCH Score Latch:  (did not observe the feeding)                 Interventions    Lactation Tools Discussed/Used     Consult Status Consult Status: Follow-up Date: 07/02/20 Follow-up type: In-patient    Dyann Kief 07/01/2020, 6:12 PM

## 2020-07-02 LAB — RPR: RPR Ser Ql: NONREACTIVE

## 2020-07-02 LAB — CBC
HCT: 29.7 % — ABNORMAL LOW (ref 36.0–46.0)
Hemoglobin: 9.8 g/dL — ABNORMAL LOW (ref 12.0–15.0)
MCH: 26.7 pg (ref 26.0–34.0)
MCHC: 33 g/dL (ref 30.0–36.0)
MCV: 80.9 fL (ref 80.0–100.0)
Platelets: 208 10*3/uL (ref 150–400)
RBC: 3.67 MIL/uL — ABNORMAL LOW (ref 3.87–5.11)
RDW: 13.9 % (ref 11.5–15.5)
WBC: 13.6 10*3/uL — ABNORMAL HIGH (ref 4.0–10.5)
nRBC: 0 % (ref 0.0–0.2)

## 2020-07-02 MED ORDER — IBUPROFEN 600 MG PO TABS
600.0000 mg | ORAL_TABLET | Freq: Four times a day (QID) | ORAL | 0 refills | Status: DC
Start: 2020-07-02 — End: 2021-07-13

## 2020-07-02 MED ORDER — MEASLES, MUMPS & RUBELLA VAC IJ SOLR
0.5000 mL | Freq: Once | INTRAMUSCULAR | Status: DC
  Filled 2020-07-02: qty 0.5

## 2020-07-02 NOTE — Discharge Instructions (Signed)

## 2020-07-02 NOTE — Progress Notes (Signed)
D/C order from MD.  Reviewed d/c instructions and prescriptions with patient and answered any questions.  Patient d/c home with infant via wheelchair by nursing/auxillary. 

## 2020-07-03 LAB — CULTURE, BETA STREP (GROUP B ONLY): Strep Gp B Culture: NEGATIVE

## 2020-07-05 ENCOUNTER — Encounter: Payer: Medicaid Other | Admitting: Obstetrics

## 2020-07-06 ENCOUNTER — Other Ambulatory Visit: Payer: Self-pay

## 2020-07-06 ENCOUNTER — Emergency Department
Admission: EM | Admit: 2020-07-06 | Discharge: 2020-07-07 | Disposition: A | Discharge status: Home / Self Care | Payer: BC Managed Care – PPO | Attending: Emergency Medicine | Admitting: Emergency Medicine

## 2020-07-06 ENCOUNTER — Emergency Department: Payer: BC Managed Care – PPO

## 2020-07-06 DIAGNOSIS — R202 Paresthesia of skin: Secondary | ICD-10-CM | POA: Diagnosis not present

## 2020-07-06 DIAGNOSIS — Z20822 Contact with and (suspected) exposure to covid-19: Secondary | ICD-10-CM | POA: Diagnosis not present

## 2020-07-06 DIAGNOSIS — R0602 Shortness of breath: Secondary | ICD-10-CM | POA: Diagnosis not present

## 2020-07-06 DIAGNOSIS — R531 Weakness: Secondary | ICD-10-CM | POA: Diagnosis present

## 2020-07-06 LAB — PROTIME-INR
INR: 0.9 (ref 0.8–1.2)
Prothrombin Time: 11.7 seconds (ref 11.4–15.2)

## 2020-07-06 LAB — COMPREHENSIVE METABOLIC PANEL
ALT: 60 U/L — ABNORMAL HIGH (ref 0–44)
AST: 53 U/L — ABNORMAL HIGH (ref 15–41)
Albumin: 2.8 g/dL — ABNORMAL LOW (ref 3.5–5.0)
Alkaline Phosphatase: 120 U/L (ref 38–126)
Anion gap: 11 (ref 5–15)
BUN: 10 mg/dL (ref 6–20)
CO2: 21 mmol/L — ABNORMAL LOW (ref 22–32)
Calcium: 8.6 mg/dL — ABNORMAL LOW (ref 8.9–10.3)
Chloride: 109 mmol/L (ref 98–111)
Creatinine, Ser: 0.73 mg/dL (ref 0.44–1.00)
GFR calc Af Amer: >60 mL/min (ref >60)
GFR calc non Af Amer: >60 mL/min (ref >60)
Glucose, Bld: 98 mg/dL (ref 70–99)
Potassium: 3.8 mmol/L (ref 3.5–5.1)
Sodium: 141 mmol/L (ref 135–145)
Total Bilirubin: 0.4 mg/dL (ref 0.3–1.2)
Total Protein: 6.6 g/dL (ref 6.5–8.1)

## 2020-07-06 LAB — DIFFERENTIAL
Abs Immature Granulocytes: 0.06 10*3/uL (ref 0.00–0.07)
Basophils Absolute: 0 10*3/uL (ref 0.0–0.1)
Basophils Relative: 0 %
Eosinophils Absolute: 0.2 10*3/uL (ref 0.0–0.5)
Eosinophils Relative: 2 %
Immature Granulocytes: 1 %
Lymphocytes Relative: 21 %
Lymphs Abs: 1.9 10*3/uL (ref 0.7–4.0)
Monocytes Absolute: 0.4 10*3/uL (ref 0.1–1.0)
Monocytes Relative: 5 %
Neutro Abs: 6.6 10*3/uL (ref 1.7–7.7)
Neutrophils Relative %: 71 %

## 2020-07-06 LAB — CBC
HCT: 35.6 % — ABNORMAL LOW (ref 36.0–46.0)
Hemoglobin: 11.3 g/dL — ABNORMAL LOW (ref 12.0–15.0)
MCH: 26.9 pg (ref 26.0–34.0)
MCHC: 31.7 g/dL (ref 30.0–36.0)
MCV: 84.8 fL (ref 80.0–100.0)
Platelets: 320 10*3/uL (ref 150–400)
RBC: 4.2 MIL/uL (ref 3.87–5.11)
RDW: 14.9 % (ref 11.5–15.5)
WBC: 9.2 10*3/uL (ref 4.0–10.5)
nRBC: 0 % (ref 0.0–0.2)

## 2020-07-06 LAB — BASIC METABOLIC PANEL: Glucose: 98

## 2020-07-06 LAB — POCT I-STAT CREATININE: Creatinine, Ser: 0.8 mg/dL (ref 0.44–1.00)

## 2020-07-06 MED ORDER — GADOBUTROL 1 MMOL/ML IV SOLN
9.0000 mL | Freq: Once | INTRAVENOUS | Status: AC | PRN
  Administered 2020-07-06: 9 mL via INTRAVENOUS

## 2020-07-06 MED ORDER — IOHEXOL 350 MG/ML SOLN
75.0000 mL | Freq: Once | INTRAVENOUS | Status: AC | PRN
  Administered 2020-07-06: 75 mL via INTRAVENOUS

## 2020-07-06 MED ORDER — SODIUM CHLORIDE 0.9% FLUSH: 3.0000 mL | Freq: Once | INTRAVENOUS | Status: DC

## 2020-07-06 NOTE — ED Notes (Signed)
Pt reports swelling to right foot for 5 days.  Pt had vaginal delivery at [facility] 5 days ago.  No pain or redness in right foot.  No sob.  Pt alert  Speech clear.  Iv in place.  Family with pt.

## 2020-07-06 NOTE — ED Notes (Signed)
Pt st numbness on right side of body. Swelling to right foot. No pain/swelling noted a this time. BI equal grip strength.  Pt speaking in complete and clear sentences. Pt A/Ox4.  NAD noted by this RN.

## 2020-07-06 NOTE — ED Triage Notes (Signed)
Pt states she had 36 gestational vaginal birth on Friday , states on Monday she noticed numbness and weakness to the right side of her body and is having BL LE, also feels like she is not able to get a deep breath. Pt is in NAD, no noted facial droop, grips equal

## 2020-07-06 NOTE — ED Notes (Signed)
Report off to yessica rn

## 2020-07-06 NOTE — ED Notes (Signed)
Pt to MRI

## 2020-07-06 NOTE — ED Notes (Signed)
ED Provider Malinda  at bedside. 

## 2020-07-06 NOTE — Consult Note (Signed)
[facility] Tele[facility]urology Consult Services  Stat Consult  Date of Service:   07/06/2020 18:17:32  Impression:     .  R20.2 - Paresthesia of skin  Comments/Sign-Out: Patient with history of vaginal delivery on Friday, anemia, GERD Has been having right sided numbness and Shortness of Breath since Monday. Etiology for Symptoms/presentation is unclear, stroke and demyelination should be considered. Last time known well>24 hours therefore not a candidate for Thrombolysis or Endovascular treatment. Will need workup for stroke and CNS demyelination with brain MRI and C spine MRI with and without contrast and further workup/treatment based on MRI results. If Brain MRI shows acute ischemic stroke then MRA head and neck, ECHO, Lipid Panel, HbA1c, Hypercoagulable labs.  CT HEAD: Reviewed CTV head  Metrics: [facility] Notification Time: 07/06/2020 18:15:47 Stamp Time: 07/06/2020 18:17:32 Callback Response Time: 07/06/2020 18:21:01  Our recommendations are outlined below.  Recommendations:     .  Will need workup for stroke and CNS demyelination with brain MRI and C spine MRI with and without contrast and further workup/treatment based on MRI results.     .  If Brain MRI shows acute ischemic stroke then MRA head and neck, ECHO, Lipid Panel, HbA1c, Hypercoagulable labs.  Imaging Studies:     .  MRI Head with and Without Contrast  Therapies:     .  Physical Therapy  Disposition: Neurology Follow Up Recommended  Sign Out:     .  Discussed with Emergency Department Provider  ----------------------------------------------------------------------------------------------------  Chief Complaint: right sided numbness  History of Present Illness: Patient is a 24 year old Female.  [facility] Tele[facility]urology Consult Note Patient with history of vaginal delivery on Friday, anemia, GERD Has been having right sided numbness and Shortness of Breath since Monday. CTV  head was unremarkable. Anticoagulation or Antiplatelet use: no Chart reviewed for Pertinent medical, surgical, family history. Chart reviewed for Medications list and allergies. Pertinent positive and negative review of systems noted in History of Present Illness. When possible Patient/Family Consented to Telemedicine evaluation. When applicable I personally reviewed images of Head CT or MRI.   Past Medical History:     . There is NO history of Stroke  Anticoagulant use:  No  Antiplatelet use: No    Examination: BP(111/75), Pulse(92), Blood Glucose(98) 1A: Level of Consciousness - Alert; keenly responsive + 0 1B: Ask Month and Age - Both Questions Right + 0 1C: Blink Eyes & Squeeze Hands - Performs Both Tasks + 0 2: Test Horizontal Extraocular Movements - Normal + 0 3: Test Visual Fields - No Visual Loss + 0 4: Test Facial Palsy (Use Grimace if Obtunded) - Normal symmetry + 0 5A: Test Left Arm Motor Drift - No Drift for 10 Seconds + 0 5B: Test Right Arm Motor Drift - No Drift for 10 Seconds + 0 6A: Test Left Leg Motor Drift - No Drift for 5 Seconds + 0 6B: Test Right Leg Motor Drift - Drift, but doesn't hit bed + 1 7: Test Limb Ataxia (FNF/Heel-Shin) - No Ataxia + 0 8: Test Sensation - Normal; No sensory loss + 0 9: Test Language/Aphasia - Normal; No aphasia + 0 10: Test Dysarthria - Normal + 0 11: Test Extinction/Inattention - No abnormality + 0  NIHSS Score: 1   Patient/Family was informed the Neurology Consult would occur via TeleHealth consult by way of interactive audio and video telecommunications and consented to receiving care in this manner.  Patient is being evaluated for possible acute neurologic impairment and high probability  of imminent or life-threatening deterioration. I spent total of 25 minutes providing care to this patient, including time for face to face visit via telemedicine, review of medical records, imaging studies and discussion of findings with  providers, the patient and/or family.   Dr Driscilla Grammes   [facility] [phone]  Case [id number]

## 2020-07-06 NOTE — ED Provider Notes (Signed)
[facility] Emergency Department Provider Note   ____________________________________________   First MD Initiated Contact with Patient 07/06/20 1806     (approximate)  I have reviewed the triage vital signs and the nursing notes.   HISTORY  Chief Complaint Numbness    HPI Rebecca Woods is a 24 y.o. female patient delivered on this past Friday.  On Monday she noticed numbness and weakness on the right side of her body and bilateral leg weakness.  She also feels like she cannot get a deep breath.  She is not running a fever.  She is breast-feeding.  She has some swelling in the right foot she thought.  She is not having any pain.         Past Medical History:  Diagnosis Date  . Acute vaginitis 09/26/2019  . Anemia    H/O  . Bacterial vaginitis 08/02/2019  . Dyspareunia due to medical condition in female 05/24/2016  . GERD (gastroesophageal reflux disease)    NO MEDS    Patient Active Problem List   Diagnosis Date Noted  . Preterm uterine contractions, antepartum, third trimester 07/01/2020  . Preterm premature rupture of membranes in third trimester 07/01/2020  . Postpartum care following vaginal delivery   . Single liveborn infant delivered vaginally   . Encounter for care or examination of lactating mother   . Abdominal pain during pregnancy in third trimester 06/30/2020  . Back pain affecting pregnancy in third trimester 06/30/2020  . [redacted] weeks gestation of pregnancy   . Umbilical hernia 03/23/2020  . Encounter for supervision of low-risk pregnancy, antepartum 12/17/2019  . Dysuria 09/26/2019  . Furuncle of groin 08/02/2019    Past Surgical History:  Procedure Laterality Date  . LAPAROSCOPY N/A 05/24/2016   Procedure: LAPAROSCOPY DIAGNOSTIC;  Surgeon: Nadara Mustard, MD;  Location: [facility] ORS;  Service: Gynecology;  Laterality: N/A;  . OVARIAN CYST SURGERY  2017    Prior to Admission medications   Medication Sig Start Date End Date  Taking? Authorizing Provider  ibuprofen (ADVIL) 600 MG tablet Take 1 tablet (600 mg total) by mouth every 6 (six) hours. 07/02/20   Vena Austria, MD  Prenatal MV-Min-FA-Omega-3 (PRENATAL GUMMIES/DHA & FA) 0.4-32.5 MG CHEW Chew 1 tablet by mouth daily.    [provider]    Allergies Patient has no known allergies.  Family History  Problem Relation Age of Onset  . Diabetes Mother   . Cancer Paternal Grandfather        STOMACH OR COLON NOT SURE  . Thyroid disease Neg Hx     Social History Social History   Tobacco Use  . Smoking status: Never Smoker  . Smokeless tobacco: Never Used  Vaping Use  . Vaping Use: Never used  Substance Use Topics  . Alcohol use: Not Currently  . Drug use: Not Currently    Types: Marijuana    Review of Systems  Constitutional: No fever/chills Eyes: No visual changes. ENT: No sore throat. Cardiovascular: Denies chest pain. Respiratory: shortness of breath. Gastrointestinal: No abdominal pain.  No nausea, no vomiting.  No diarrhea.  No constipation. Genitourinary: Negative for dysuria. Musculoskeletal: Negative for back pain. Skin: Negative for rash. Neurological: Negative for headaches, focal weakness   ____________________________________________   PHYSICAL EXAM:  VITAL SIGNS: ED Triage Vitals  Enc Vitals Group     BP 07/06/20 1006 115/73     Pulse Rate 07/06/20 1006 99     Resp 07/06/20 1006 18  Temp 07/06/20 1006 97.9 F (36.6 C)     Temp Source 07/06/20 1006 Oral     SpO2 07/06/20 1006 97 %     Weight --      Height --      Head Circumference --      Peak Flow --      Pain Score 07/06/20 1032 0     Pain Loc --      Pain Edu? --    Constitutional: Alert and oriented. Well appearing and in no acute distress. Eyes: Conjunctivae are normal. PER Head: Atraumatic. Nose: No congestion/rhinnorhea. Mouth/Throat: Mucous membranes are moist.  Oropharynx non-erythematous. Neck: No stridor Cardiovascular: Normal  rate, regular rhythm. Grossly normal heart sounds.  Good peripheral circulation. Respiratory: Normal respiratory effort.  No retractions. Lungs CTAB. Gastrointestinal: Soft and nontender. No distention. No abdominal bruits. N Musculoskeletal: No lower extremity tenderness nor edema. Neurologic:  Normal speech and language.  Patient reports some numbness on the right side arm and the leg.  Not in the face.  Cranial nerves II through XII are intact although visual fields were not checked cerebellar finger-to-nose seems to be normal but the rapid alternating movements on the right side are a little slow.  Heel-to-shin is normal bilaterally.  Motor strength seems to be slightly weak on the right in the arm.   Skin:  Skin is warm, dry and intact. No rash noted.   ____________________________________________   LABS (all labs ordered are listed, but only abnormal results are displayed)  Labs Reviewed  CBC - Abnormal; Notable for the following components:      Result Value   Hemoglobin 11.3 (*)    HCT 35.6 (*)    All other components within normal limits  COMPREHENSIVE METABOLIC PANEL - Abnormal; Notable for the following components:   CO2 21 (*)    Calcium 8.6 (*)    Albumin 2.8 (*)    AST 53 (*)    ALT 60 (*)    All other components within normal limits  PROTIME-INR  DIFFERENTIAL  APTT  POCT I-STAT CREATININE  CBG MONITORING, ED   ____________________________________________  EKG   ____________________________________________  RADIOLOGY  ED MD interpretation: CT angio of the chest and CT of head and venogram of head were read by radiology are normal I reviewed the CT of the head.  Official radiology report(s): CT Angio Chest PE W and/or Wo Contrast  Result Date: 07/06/2020 CLINICAL DATA:  24 year old recently postpartum female. new onset numbness and weakness in the right side of her body, shortness of breath. History of COVID-19 reportedly last year. EXAM: CT ANGIOGRAPHY CHEST  WITH CONTRAST TECHNIQUE: Multidetector CT imaging of the chest was performed using the standard protocol during bolus administration of intravenous contrast. Multiplanar CT image reconstructions and MIPs were obtained to evaluate the vascular anatomy. CONTRAST:  86mL OMNIPAQUE IOHEXOL 350 MG/ML SOLN COMPARISON:  Chest radiographs 12/22/2014. FINDINGS: Cardiovascular: Adequate contrast bolus timing in the pulmonary arterial tree. No focal filling defect identified in the pulmonary arteries to suggest acute pulmonary embolism. No cardiomegaly or pericardial effusion. Negative visible aorta. Mediastinum/Nodes: Negative. Lungs/Pleura: Major airways are patent. Mildly low lung volumes with subtle bilateral atelectasis. Both lungs otherwise appear clear. No pleural effusion. Upper Abdomen: Negative visible liver, spleen, pancreas, adrenal glands, kidneys, and bowel in the upper abdomen. Musculoskeletal: Negative. Review of the MIP images confirms the above findings. IMPRESSION: 1. Negative for acute pulmonary embolus. 2. Normal chest CTA aside from minor atelectasis. Electronically Signed  By: Odessa Fleming M.D.   On: 07/06/2020 11:39   CT VENOGRAM HEAD  Result Date: 07/06/2020 CLINICAL DATA:  Dural venous sinus thrombosis suspected. Additional history provided: Patient COVID positive in August 2020, recently postpartum, numbness and weakness to right side of body and bilateral lower extremities. EXAM: CT VENOGRAM HEAD TECHNIQUE: Contiguous axial images were obtained from the base of the skull through the vertex without intravenous contrast. Coronal and sagittal reconstructions were also submitted. Subsequently, CT venography of the head was performed. Coronal and sagittal thin reconstructions were submitted. Additionally, axial, coronal and sagittal thick MIP reconstructions were submitted. CONTRAST:  69mL OMNIPAQUE IOHEXOL 350 MG/ML SOLN COMPARISON:  Head CT 04/16/2017. FINDINGS: CT head: Brain: Cerebral volume is normal.  There is no acute intracranial hemorrhage. No demarcated cortical infarct. No extra-axial fluid collection. No evidence of intracranial mass. No midline shift. Vascular: No hyperdense vessel. Skull: Normal. Negative for fracture or focal lesion. Sinuses/Orbits: Visualized orbits show no acute finding. No significant paranasal sinus disease or mastoid effusion at the imaged levels. CT venography: The superior sagittal sinus, internal cerebral veins, vein of Galen, straight sinus, transverse sinuses, sigmoid sinuses and visualized jugular veins are patent. There is no appreciable intracranial venous thrombosis. IMPRESSION: CT head: Unremarkable non-contrast CT appearance of the brain. No evidence of acute intracranial abnormality. CT venography head: No evidence of intracranial venous thrombosis. Electronically Signed   By: Jackey Loge DO   On: 07/06/2020 11:45    ____________________________________________   PROCEDURES  Procedure(s) performed (including Critical Care):  Procedures   ____________________________________________   INITIAL IMPRESSION / ASSESSMENT AND PLAN / ED COURSE  Patient seen by teleneurology who agrees with my assessment of neuro exam.  We have ordered an MRI of the head and neck in case there is a stroke or multiple sclerosis which is another possibility.      ----------------------------------------- 11:38 PM on 07/06/2020 -----------------------------------------  At  this time the MRIs have not been read yet.  I am signing the patient out to oncoming provider.  Disposition will depend on the results of the MRI.   Clinical Course as of Jul 06 2337  Wed Jul 06, 2020  2303 Monocytes Relative: 5 [PM]    Clinical Course User Index [PM] Arnaldo Natal, MD     ____________________________________________   FINAL CLINICAL IMPRESSION(S) / ED DIAGNOSES  Final diagnoses:  Weakness     ED Discharge Orders    None       Note:  This document was  prepared using Dragon voice recognition software and may include unintentional dictation errors.    Arnaldo Natal, MD 07/06/20 [phone]

## 2020-07-06 NOTE — ED Notes (Signed)
Pt ate dinner from chick fil [employer] brought in by family earlier .  Pt alert  Speech  Clear.

## 2020-07-06 NOTE — Progress Notes (Signed)
CODE STROKE- PHARMACY COMMUNICATION   Time CODE STROKE called/page received:1849  Time response to CODE STROKE was made (in person or via phone): 1900  Time Stroke Kit retrieved from [place] (only if needed): NA  Name of Provider/Nurse contacted: Dr. Cinda Quest - no tPA    Tawnya Crook ,PharmD Clinical Pharmacist  07/06/2020  7:37 PM

## 2020-07-06 NOTE — Progress Notes (Addendum)
   07/06/20 1858  Clinical Encounter Type  Visited With Other (Comment)  Visit Type Initial  Referral From Nurse  Consult/Referral To Chaplain  Chaplain responded to a CODE STROKE page. After being in ED for a few minutes, chaplain was told by secretary that she wasn't needed, therefore chaplain left.

## 2020-07-07 LAB — SARS CORONAVIRUS 2 BY RT PCR (HOSPITAL ORDER, PERFORMED IN ~~LOC~~ HOSPITAL LAB): SARS Coronavirus 2: NEGATIVE

## 2020-07-07 NOTE — ED Notes (Signed)
Pt unable to sign E-signature due to signature pad malfunction. Pt verbalized understanding of d/c instructions and had no additional questions or concerns for this RN or provider. Pt left with d/c instructions and gathered all personal belongings from room and removed them prior to ED departure.   

## 2020-07-12 ENCOUNTER — Telehealth: Payer: Self-pay | Admitting: Advanced Practice Midwife

## 2020-07-12 NOTE — Telephone Encounter (Signed)
Patient scheduled 10/15 for mirena insertion with Erskine Squibb.

## 2020-07-13 NOTE — Telephone Encounter (Signed)
Noted. Will order to arrive by apt date/time. 

## 2020-07-22 NOTE — ED Provider Notes (Signed)
EKG done on 07/06/2020 read interpreted by me shows normal sinus rhythm rate of 97 normal axis nonspecific ST-T wave changes there is S1Q3T3 present.   Arnaldo Natal, MD 07/22/20 [phone]

## 2020-07-27 ENCOUNTER — Inpatient Hospital Stay: Admit: 2020-07-27 | Payer: Self-pay

## 2020-08-12 ENCOUNTER — Ambulatory Visit (INDEPENDENT_AMBULATORY_CARE_PROVIDER_SITE_OTHER): Payer: BC Managed Care – PPO | Admitting: Advanced Practice Midwife

## 2020-08-12 ENCOUNTER — Encounter: Payer: Self-pay | Admitting: Advanced Practice Midwife

## 2020-08-12 ENCOUNTER — Other Ambulatory Visit: Payer: Self-pay

## 2020-08-12 DIAGNOSIS — Z23 Encounter for immunization: Secondary | ICD-10-CM | POA: Diagnosis not present

## 2020-08-12 DIAGNOSIS — Z30011 Encounter for initial prescription of contraceptive pills: Secondary | ICD-10-CM | POA: Diagnosis not present

## 2020-08-12 LAB — POCT URINE PREGNANCY: Preg Test, Ur: NEGATIVE

## 2020-08-12 MED ORDER — NORETHINDRONE 0.35 MG PO TABS: 1.0000 | ORAL_TABLET | Freq: Every day | ORAL | 4 refills | Status: DC

## 2020-08-12 NOTE — Progress Notes (Signed)
Postpartum Visit  Chief Complaint:  Chief Complaint  Patient presents with  . Postpartum Care    History of Present Illness: Patient is a 24 y.o. [id number] presents for postpartum visit.  Review the Delivery Report for details.    Newborn Details:  SINGLETON :  1. BabyGender female. Birth weight: 2900 g Maternal Details:  Breast or formula feeding: breast and formula feeding Intercourse: yes- unprotected Contraception after delivery: plans to start POP after cycle starts and use condoms until then Any bowel or bladder issues: No  Post partum depression/anxiety noted:  no Edinburgh Post-Partum Depression Score: 0 Date of last PAP: 1 year ago  no abnormalities   Review of Systems: Review of Systems  Constitutional: Negative for chills and fever.  HENT: Negative for congestion, ear discharge, ear pain, hearing loss, sinus pain and sore throat.   Eyes: Negative for blurred vision and double vision.  Respiratory: Negative for cough, shortness of breath and wheezing.   Cardiovascular: Negative for chest pain, palpitations and leg swelling.  Gastrointestinal: Negative for abdominal pain, blood in stool, constipation, diarrhea, heartburn, melena, nausea and vomiting.  Genitourinary: Negative for dysuria, flank pain, frequency, hematuria and urgency.  Musculoskeletal: Negative for back pain, joint pain and myalgias.  Skin: Negative for itching and rash.  Neurological: Negative for dizziness, tingling, tremors, sensory change, speech change, focal weakness, seizures, loss of consciousness, weakness and headaches.  Endo/Heme/Allergies: Negative for environmental allergies. Does not bruise/bleed easily.  Psychiatric/Behavioral: Negative for depression, hallucinations, memory loss, substance abuse and suicidal ideas. The patient is not nervous/anxious and does not have insomnia.     Past Medical History:  Past Medical History:  Diagnosis Date  . Acute vaginitis 09/26/2019  . Anemia     H/O  . Bacterial vaginitis 08/02/2019  . Dyspareunia due to medical condition in female 05/24/2016  . GERD (gastroesophageal reflux disease)    NO MEDS    Past Surgical History:  Past Surgical History:  Procedure Laterality Date  . LAPAROSCOPY N/A 05/24/2016   Procedure: LAPAROSCOPY DIAGNOSTIC;  Surgeon: Nadara Mustard, MD;  Location: [facility] ORS;  Service: Gynecology;  Laterality: N/A;  . OVARIAN CYST SURGERY  2017    Family History:  Family History  Problem Relation Age of Onset  . Diabetes Mother   . Cancer Paternal Grandfather        STOMACH OR COLON NOT SURE  . Thyroid disease Neg Hx     Social History:  Social History   Socioeconomic History  . Marital status: Single    Spouse name: Not on file  . Number of children: 1  . Years of education: Not on file  . Highest education level: High school graduate  Occupational History  . Occupation: ADMIN    Comment: [employer]  Tobacco Use  . Smoking status: Never Smoker  . Smokeless tobacco: Never Used  Vaping Use  . Vaping Use: Never used  Substance and Sexual Activity  . Alcohol use: Not Currently  . Drug use: Not Currently    Types: Marijuana  . Sexual activity: Yes    Comment: mirena-planning  Other Topics Concern  . Not on file  Social History Narrative  . Not on file   Social Determinants of Health   Financial Resource Strain:   . Difficulty of Paying Living Expenses: Not on file  Food Insecurity:   . Worried About Programme researcher, broadcasting/film/video in the Last Year: Not on file  . Ran Out of Food in the  Last Year: Not on file  Transportation Needs:   . Lack of Transportation (Medical): Not on file  . Lack of Transportation (Non-Medical): Not on file  Physical Activity:   . Days of Exercise per Week: Not on file  . Minutes of Exercise per Session: Not on file  Stress:   . Feeling of Stress : Not on file  Social Connections:   . Frequency of Communication with Friends and Family: Not on file  . Frequency of Social  Gatherings with Friends and Family: Not on file  . Attends Religious Services: Not on file  . Active Member of Clubs or Organizations: Not on file  . Attends Banker Meetings: Not on file  . Marital Status: Not on file  Intimate Partner Violence:   . Fear of Current or Ex-Partner: Not on file  . Emotionally Abused: Not on file  . Physically Abused: Not on file  . Sexually Abused: Not on file    Allergies:  No Known Allergies  Medications: Prior to Admission medications   Medication Sig Start Date End Date Taking? Authorizing Provider  ibuprofen (ADVIL) 600 MG tablet Take 1 tablet (600 mg total) by mouth every 6 (six) hours. Patient not taking: Reported on 08/12/2020 07/02/20   Vena Austria, MD  norethindrone (MICRONOR) 0.35 MG tablet Take 1 tablet (0.35 mg total) by mouth daily. 08/12/20   Tresea Mall, CNM  Prenatal MV-Min-FA-Omega-3 (PRENATAL GUMMIES/DHA & FA) 0.4-32.5 MG CHEW Chew 1 tablet by mouth daily. Patient not taking: Reported on 08/12/2020    [provider]    Physical Exam Blood pressure 100/60, height 5\' 7"  (1.702 m), weight 187 lb (84.8 kg, currently breastfeeding.    General: NAD HEENT: normocephalic, anicteric Pulmonary: No increased work of breathing Abdomen: NABS, soft, non-tender, non-distended.  Umbilicus without lesions.  No hepatomegaly, splenomegaly or masses palpable. No evidence of hernia. Genitourinary:  External: Normal external female genitalia.  Normal urethral meatus, normal Bartholin's and Skene's glands.    Vagina: Normal vaginal mucosa, no evidence of prolapse.    Cervix: Grossly normal in appearance, no bleeding  Uterus: Non-enlarged, mobile, normal contour.  No CMT  Adnexa: ovaries non-enlarged, no adnexal masses  Rectal: deferred Extremities: no edema, erythema, or tenderness Neurologic: Grossly intact Psychiatric: mood appropriate, affect full   Edinburgh Postnatal Depression Scale - 08/12/20 0900       Edinburgh Postnatal Depression Scale:  In the Past 7 Days   I have been able to laugh and see the funny side of things. 0    I have looked forward with enjoyment to things. 0    I have blamed myself unnecessarily when things went wrong. 0    I have been anxious or worried for no good reason. 0    I have felt scared or panicky for no good reason. 0    Things have been getting on top of me. 0    I have been so unhappy that I have had difficulty sleeping. 0    I have felt sad or miserable. 0    I have been so unhappy that I have been crying. 0    The thought of harming myself has occurred to me. 0    Edinburgh Postnatal Depression Scale Total 0           Assessment: 24 y.o. 25 presenting for 6 week postpartum visit  Plan: Problem List Items Addressed This Visit    None    Visit Diagnoses  6 weeks postpartum follow-up    -  Primary   Encounter for initial prescription of contraceptive pills       Relevant Orders   POCT urine pregnancy (Completed)   Need for immunization against influenza       Relevant Orders   Flu Vaccine QUAD 36+ mos IM (Completed)       1) Contraception - Education given regarding options for contraception, as well as compatibility with breast feeding if applicable.  Patient plans on oral progesterone-only contraceptive for contraception.  2)  Pap - ASCCP guidelines and rationale discussed.  ASCCP guidelines and rational discussed.  Patient opts for every 3 years screening interval  3) Patient underwent screening for postpartum depression with no signs of depression  4) Return in about 1 year (around 08/12/2021) for annual established gyn.   Tresea Mall, CNM [facility] 08/12/2020, 3:30 PM

## 2020-09-09 ENCOUNTER — Other Ambulatory Visit: Payer: BC Managed Care – PPO | Admitting: Nurse Practitioner

## 2020-09-16 ENCOUNTER — Other Ambulatory Visit: Payer: BC Managed Care – PPO | Admitting: Nurse Practitioner

## 2020-09-20 ENCOUNTER — Other Ambulatory Visit: Payer: BC Managed Care – PPO | Admitting: Internal Medicine

## 2020-11-01 ENCOUNTER — Other Ambulatory Visit: Payer: BC Managed Care – PPO | Admitting: Nurse Practitioner

## 2020-11-03 ENCOUNTER — Telehealth: Payer: Self-pay

## 2020-11-03 NOTE — Telephone Encounter (Signed)
Rescheduled missed ov from 11-01-20. Patient was advised verbally to keep next appointment or there may be a possibility we cannot schedule in future.

## 2020-11-21 ENCOUNTER — Ambulatory Visit (INDEPENDENT_AMBULATORY_CARE_PROVIDER_SITE_OTHER): Payer: BC Managed Care – PPO | Admitting: Physician Assistant

## 2020-11-21 ENCOUNTER — Other Ambulatory Visit: Payer: Self-pay

## 2020-11-21 ENCOUNTER — Encounter: Payer: Self-pay | Admitting: Physician Assistant

## 2020-11-21 VITALS — BP 109/64 | HR 98 | Temp 98.0°F | Resp 16 | Ht 67.0 in | Wt 180.8 lb

## 2020-11-21 DIAGNOSIS — Z124 Encounter for screening for malignant neoplasm of cervix: Secondary | ICD-10-CM

## 2020-11-21 DIAGNOSIS — N760 Acute vaginitis: Secondary | ICD-10-CM

## 2020-11-21 DIAGNOSIS — K429 Umbilical hernia without obstruction or gangrene: Secondary | ICD-10-CM | POA: Diagnosis not present

## 2020-11-21 DIAGNOSIS — R300 Dysuria: Secondary | ICD-10-CM

## 2020-11-21 DIAGNOSIS — Z0001 Encounter for general adult medical examination with abnormal findings: Secondary | ICD-10-CM

## 2020-11-21 DIAGNOSIS — B9689 Other specified bacterial agents as the cause of diseases classified elsewhere: Secondary | ICD-10-CM

## 2020-11-21 LAB — OB RESULTS CONSOLE GC/CHLAMYDIA: Chlamydia: NEGATIVE

## 2020-11-21 LAB — HM PAP SMEAR: HM Pap smear: NEGATIVE

## 2020-11-21 NOTE — Progress Notes (Signed)
[facility] [address] [place], [place] [address]  Internal MEDICINE  Office Visit Note  Patient Name: Rebecca Woods  [id number]  [id number]  Date of Service: 11/27/2020  Chief Complaint  Patient presents with  . Annual Exam    Concerned about a hernia after pregnancy   . Gastroesophageal Reflux     HPI Pt is here for routine health maintenance examination She is concerned she has an umbilical hernia, which she really noticed about 2 months ago. She let her OB know, but it never fully went away. She sates it is uncomfortable when she is working out and she noticed it bulges some, but it goes away on its own if she leaves it alone. Tender at times when working out or lifting heavy objects. Doing well since baby in Sept. Menstrual cycles: started back in early Jan and lasted 7 days, but then restarted again. She notes she started birth control tab around that time. She is also breast feeding. Educated on proper use of birth control and to set a reminder to take it at the same time daily.  Current Medication: Outpatient Encounter Medications as of 11/21/2020  Medication Sig  . ibuprofen (ADVIL) 600 MG tablet Take 1 tablet (600 mg total) by mouth every 6 (six) hours. (Patient not taking: Reported on 08/12/2020)  . norethindrone (MICRONOR) 0.35 MG tablet Take 1 tablet (0.35 mg total) by mouth daily.  . Prenatal MV-Min-FA-Omega-3 (PRENATAL GUMMIES/DHA & FA) 0.4-32.5 MG CHEW Chew 1 tablet by mouth daily. (Patient not taking: Reported on 08/12/2020)   No facility-administered encounter medications on file as of 11/21/2020.    Surgical History: Past Surgical History:  Procedure Laterality Date  . LAPAROSCOPY N/A 05/24/2016   Procedure: LAPAROSCOPY DIAGNOSTIC;  Surgeon: Nadara Mustard, MD;  Location: [facility] ORS;  Service: Gynecology;  Laterality: N/A;  . OVARIAN CYST SURGERY  2017    Medical History: Past Medical History:  Diagnosis Date  . Acute vaginitis 09/26/2019  .  Anemia    H/O  . Bacterial vaginitis 08/02/2019  . Dyspareunia due to medical condition in female 05/24/2016  . GERD (gastroesophageal reflux disease)    NO MEDS    Family History: Family History  Problem Relation Age of Onset  . Diabetes Mother   . Cancer Paternal Grandfather        STOMACH OR COLON NOT SURE  . Thyroid disease Neg Hx    Social History   Socioeconomic History  . Marital status: Single    Spouse name: Not on file  . Number of children: 1  . Years of education: Not on file  . Highest education level: High school graduate  Occupational History  . Occupation: ADMIN    Comment: [employer]  Tobacco Use  . Smoking status: Never Smoker  . Smokeless tobacco: Never Used  Vaping Use  . Vaping Use: Never used  Substance and Sexual Activity  . Alcohol use: Not Currently  . Drug use: Not Currently    Types: Marijuana  . Sexual activity: Yes    Comment: mirena-planning  Other Topics Concern  . Not on file  Social History Narrative  . Not on file   Social Determinants of Health   Financial Resource Strain: Not on file  Food Insecurity: Not on file  Transportation Needs: Not on file  Physical Activity: Not on file  Stress: Not on file  Social Connections: Not on file      Review of Systems  Constitutional: Negative for activity change,  appetite change, chills, diaphoresis, fatigue, fever and unexpected weight change.  HENT: Negative for congestion, ear discharge, ear pain, facial swelling, hearing loss, nosebleeds, postnasal drip, rhinorrhea, sinus pressure, sinus pain, sneezing, sore throat, tinnitus, trouble swallowing and voice change.   Eyes: Negative for photophobia, pain, discharge, redness, itching and visual disturbance.  Respiratory: Negative for apnea, cough, choking, chest tightness, shortness of breath, wheezing and stridor.   Cardiovascular: Negative for chest pain, palpitations and leg swelling.  Gastrointestinal: Negative for abdominal distention,  abdominal pain, anal bleeding, constipation, diarrhea, nausea and rectal pain.       Some abdominal tenderness and discomfort above umbilicus during work outs or heavy liftingg  Endocrine: Negative for cold intolerance, heat intolerance, polydipsia, polyphagia and polyuria.  Genitourinary: Negative for difficulty urinating, flank pain, frequency, genital sores, hematuria, menstrual problem, pelvic pain, urgency, vaginal bleeding, vaginal discharge and vaginal pain.  Musculoskeletal: Negative for arthralgias, back pain, gait problem, joint swelling, myalgias and neck pain.  Skin: Negative for color change, pallor, rash and wound.  Allergic/Immunologic: Negative for environmental allergies, food allergies and immunocompromised state.  Neurological: Negative for dizziness, seizures, syncope, facial asymmetry, speech difficulty, weakness, light-headedness, numbness and headaches.  Hematological: Negative for adenopathy. Does not bruise/bleed easily.  Psychiatric/Behavioral: Negative for agitation, behavioral problems, confusion, decreased concentration, dysphoric mood, hallucinations, self-injury, sleep disturbance and suicidal ideas. The patient is not nervous/anxious and is not hyperactive.      Vital Signs: BP 109/64   Pulse 98   Temp 98 F (36.7 C)   Resp 16   Ht 5\' 7"  (1.702 m)   Wt 180 lb 12.8 oz (82 kg)   SpO2 99%   BMI 28.32 kg/m    Physical Exam Constitutional:      General: She is not in acute distress.    Appearance: She is well-developed. She is not diaphoretic.  HENT:     Head: Normocephalic and atraumatic.     Right Ear: External ear normal.     Left Ear: External ear normal.     Nose: Nose normal.     Mouth/Throat:     Pharynx: No oropharyngeal exudate.  Eyes:     General: No scleral icterus.       Right eye: No discharge.        Left eye: No discharge.     Conjunctiva/sclera: Conjunctivae normal.     Pupils: Pupils are equal, round, and reactive to light.  Neck:      Thyroid: No thyromegaly.     Vascular: No JVD.     Trachea: No tracheal deviation.  Cardiovascular:     Rate and Rhythm: Normal rate and regular rhythm.     Heart sounds: Normal heart sounds. No murmur heard. No friction rub. No gallop.   Pulmonary:     Effort: Pulmonary effort is normal. No respiratory distress.     Breath sounds: Normal breath sounds. No stridor. No wheezing or rales.  Chest:     Chest wall: No tenderness.  Breasts:     Right: Normal.     Left: Normal.    Abdominal:     General: Bowel sounds are normal. There is no distension.     Palpations: Abdomen is soft. There is no mass.     Tenderness: There is no abdominal tenderness. There is no guarding or rebound.     Comments: Possible small umbilical hernia upon bearing down  Genitourinary:    General: Normal vulva.     Labia:  Right: No rash.        Left: No rash.      Urethra: No urethral pain.     Vagina: No vaginal discharge.     Cervix: Normal.     Uterus: Normal.      Adnexa: Right adnexa normal and left adnexa normal.  Musculoskeletal:        General: No tenderness or deformity. Normal range of motion.     Cervical back: Normal range of motion and neck supple.  Lymphadenopathy:     Cervical: No cervical adenopathy.  Skin:    General: Skin is warm and dry.     Coloration: Skin is not pale.     Findings: No erythema or rash.  Neurological:     Mental Status: She is alert.     Cranial Nerves: No cranial nerve deficit.     Motor: No abnormal muscle tone.     Coordination: Coordination normal.     Deep Tendon Reflexes: Reflexes are normal and symmetric.  Psychiatric:        Behavior: Behavior normal.        Thought Content: Thought content normal.        Judgment: Judgment normal.      LABS: Recent Results (from the past 2160 hour(s))  IGP, Aptima HPV     Status: None   Collection Time: 11/21/20  9:51 AM  Result Value Ref Range   Interpretation NILM     Comment: NEGATIVE FOR  INTRAEPITHELIAL LESION OR MALIGNANCY.   Category NIL     Comment: Negative for Intraepithelial Lesion   Adequacy SECNI     Comment: Satisfactory for evaluation. No endocervical component is identified.   Clinician Provided ICD10 Comment     Comment: Z12.4   Performed by: Comment     Comment: Hansel Feinstein, Cytotechnologist (ASCP)   Note: Comment     Comment: The Pap smear is a screening test designed to aid in the detection of premalignant and malignant conditions of the uterine cervix.  It is not a diagnostic procedure and should not be used as the sole means of detecting cervical cancer.  Both false-positive and false-negative reports do occur.    Test Methodology Comment     Comment: This liquid based ThinPrep(R) pap test was screened with the use of an image guided system.    HPV Aptima Negative Negative    Comment: This nucleic acid amplification test detects fourteen high-risk HPV types (16,18,31,33,35,39,45,51,52,56,58,59,66,68) without differentiation.   UA/M w/rflx Culture, Routine     Status: None   Collection Time: 11/21/20 11:54 AM   Specimen: Urine   Urine  Result Value Ref Range   Specific Gravity, UA 1.020 1.005 - 1.030   pH, UA 6.5 5.0 - 7.5   Color, UA Yellow Yellow   Appearance Ur Clear Clear   Leukocytes,UA Negative Negative   Protein,UA Negative Negative/Trace   Glucose, UA Negative Negative   Ketones, UA Negative Negative   RBC, UA Negative Negative   Bilirubin, UA Negative Negative   Urobilinogen, Ur 0.2 0.2 - 1.0 mg/dL   Nitrite, UA Negative Negative   Microscopic Examination Comment     Comment: Microscopic follows if indicated.   Microscopic Examination See below:     Comment: Microscopic was indicated and was performed.   Urinalysis Reflex Comment     Comment: This specimen will not reflex to a Urine Culture.  Microscopic Examination     Status: None   Collection Time: 11/21/20 11:54  AM   Urine  Result Value Ref Range   WBC, UA 0-5 0 - 5  /hpf   RBC None seen 0 - 2 /hpf   Epithelial Cells (non renal) 0-10 0 - 10 /hpf   Casts None seen None seen /lpf   Bacteria, UA None seen None seen/Few  NuSwab Vaginitis Plus (VG+)     Status: Abnormal   Collection Time: 11/21/20 11:55 AM  Result Value Ref Range   Atopobium vaginae High - 2 (A) Score   BVAB 2 Moderate - 1 Score   Megasphaera 1 High - 2 (A) Score    Comment: Calculate total score by adding the 3 individual bacterial vaginosis (BV) marker scores together.  Total score is interpreted as follows: Total score 0-1: Indicates the absence of BV. Total score   2: Indeterminate for BV. Additional clinical                  data should be evaluated to establish a                  diagnosis. Total score 3-6: Indicates the presence of BV. This test was developed and its performance characteristics determined by [facility].  It has not been cleared or approved by the Food and Drug Administration.    Candida albicans, NAA Negative Negative   Candida glabrata, NAA Negative Negative   Trich vag by NAA Negative Negative   Chlamydia trachomatis, NAA Negative Negative   Neisseria gonorrhoeae, NAA Negative Negative        Assessment/Plan: 1. Encounter for general adult medical examination with abnormal findings Wellness exam with pap smear completed, now 4 months postpartum. Educated on birth control use.  2. Umbilical hernia without obstruction and without gangrene Recommended she wear abdominal support when working out as her abdominal muscles heal postpartum. Pt will let us know if worsening/not improving with time.  3. Routine cervical smear - IGP, Aptima HPV  4. Vulvovaginitis, bacterial - NuSwab Vaginitis Plus (VG+)  5. Dysuria - UA/M w/rflx Culture, Routine  General Counseling: Shanaya verbalizes understanding of the findings of todays visit and agrees with plan of treatment. I have discussed any further diagnostic evaluation that may be needed or ordered today. We  also reviewed her medications today. she has been encouraged to call the office with any questions or concerns that should arise related to todays visit.    Counseling:    Orders Placed This Encounter  Procedures  . Microscopic Examination  . UA/M w/rflx Culture, Routine  . NuSwab Vaginitis Plus (VG+)    No orders of the defined types were placed in this encounter.   Total time spent:40 Minutes  Time spent includes review of chart, medications, test results, and follow up plan with the patient.     Lyndon Code, MD  Internal Medicine

## 2020-11-22 LAB — UA/M W/RFLX CULTURE, ROUTINE
Bilirubin, UA: NEGATIVE
Glucose, UA: NEGATIVE
Ketones, UA: NEGATIVE
Leukocytes,UA: NEGATIVE
Nitrite, UA: NEGATIVE
Protein,UA: NEGATIVE
RBC, UA: NEGATIVE
Specific Gravity, UA: 1.02 (ref 1.005–1.030)
Urobilinogen, Ur: 0.2 mg/dL (ref 0.2–1.0)
pH, UA: 6.5 (ref 5.0–7.5)

## 2020-11-22 LAB — MICROSCOPIC EXAMINATION
Bacteria, UA: NONE SEEN
Casts: NONE SEEN /lpf
RBC, Urine: NONE SEEN /hpf (ref 0–2)

## 2020-11-23 LAB — IGP, APTIMA HPV: HPV Aptima: NEGATIVE

## 2020-11-24 LAB — NUSWAB VAGINITIS PLUS (VG+)
Atopobium vaginae: HIGH Score — AB
Candida albicans, NAA: NEGATIVE
Candida glabrata, NAA: NEGATIVE
Chlamydia trachomatis, NAA: NEGATIVE
Megasphaera 1: HIGH Score — AB
Neisseria gonorrhoeae, NAA: NEGATIVE
Trich vag by NAA: NEGATIVE

## 2020-11-30 ENCOUNTER — Telehealth: Payer: Self-pay

## 2020-11-30 NOTE — Telephone Encounter (Signed)
-----   Message from Lyndon Code, MD sent at 11/27/2020  7:37 AM EST ----- Pap smeear looks good, Emya Picado can u tell her she does have some bacterial growth in her vagina which ususally gets better on its own however if she is having excessive discharge, any itching or strong odor we can treat. Please let Lauren know

## 2020-11-30 NOTE — Telephone Encounter (Signed)
Please discuss

## 2020-12-01 ENCOUNTER — Other Ambulatory Visit: Payer: Self-pay | Admitting: Physician Assistant

## 2020-12-01 DIAGNOSIS — N760 Acute vaginitis: Secondary | ICD-10-CM

## 2020-12-01 DIAGNOSIS — B9689 Other specified bacterial agents as the cause of diseases classified elsewhere: Secondary | ICD-10-CM

## 2020-12-01 MED ORDER — METRONIDAZOLE 0.75 % VA GEL: 1.0000 | Freq: Every day | VAGINAL | 0 refills | Status: DC

## 2020-12-01 NOTE — Telephone Encounter (Signed)
Please let her know I sent her metronidazole gel to be used intravaginally for 3 nights if itching/discharge is still bothersome.

## 2020-12-01 NOTE — Telephone Encounter (Signed)
Called and informed pt about medication sent to pharmacy and instructions on how medication is to be used.  Pt understood directions due to breastfeeding

## 2021-01-19 ENCOUNTER — Telehealth: Payer: Self-pay

## 2021-01-19 NOTE — Telephone Encounter (Signed)
Pt calling for refill of bcp.  [phone]  Adv pt should have refills on file at pharm thru Oct.

## 2021-02-24 ENCOUNTER — Telehealth: Payer: Self-pay

## 2021-02-24 NOTE — Telephone Encounter (Signed)
Pt calling; had faint line on home preg test; what to do?  [phone]  Adv pt to wait another week or two and repeat test.  If positive, sched NOB appt,

## 2021-03-30 ENCOUNTER — Ambulatory Visit: Payer: Medicaid Other | Admitting: Physician Assistant

## 2021-06-06 ENCOUNTER — Encounter: Payer: Self-pay | Admitting: Internal Medicine

## 2021-07-13 ENCOUNTER — Ambulatory Visit (INDEPENDENT_AMBULATORY_CARE_PROVIDER_SITE_OTHER): Payer: BC Managed Care – PPO | Admitting: Nurse Practitioner

## 2021-07-13 ENCOUNTER — Encounter: Payer: Self-pay | Admitting: Nurse Practitioner

## 2021-07-13 ENCOUNTER — Other Ambulatory Visit: Payer: Self-pay

## 2021-07-13 VITALS — BP 114/82 | HR 91 | Temp 98.9°F | Resp 16 | Ht 67.0 in | Wt 187.6 lb

## 2021-07-13 DIAGNOSIS — Z23 Encounter for immunization: Secondary | ICD-10-CM | POA: Diagnosis not present

## 2021-07-13 DIAGNOSIS — K429 Umbilical hernia without obstruction or gangrene: Secondary | ICD-10-CM | POA: Diagnosis not present

## 2021-07-13 NOTE — Progress Notes (Signed)
[facility] [address] [place], [place] [address]  Internal MEDICINE  Office Visit Note  Patient Name: Rebecca Woods  [id number]  [id number]  Date of Service: 07/13/2021  Chief Complaint  Patient presents with   Follow-up    Hernia near bellybutton noticed it during pregnancy     HPI Rebecca Woods presents for a follow up visit for possible umbilical hernia. She noticed the hernia during her pregnancy. Her son is now 25 yo. She can feel the hernia especially when she is lifting weights at the gym and she is worried that she will hurt something. Recently, she felt a sharp pain in that area and wants to see if the hernia can be repaired.    Current Medication: Outpatient Encounter Medications as of 07/13/2021  Medication Sig   norethindrone (MICRONOR) 0.35 MG tablet Take 1 tablet (0.35 mg total) by mouth daily.   [DISCONTINUED] ibuprofen (ADVIL) 600 MG tablet Take 1 tablet (600 mg total) by mouth every 6 (six) hours. (Patient not taking: Reported on 08/12/2020)   [DISCONTINUED] Prenatal MV-Min-FA-Omega-3 (PRENATAL GUMMIES/DHA & FA) 0.4-32.5 MG CHEW Chew 1 tablet by mouth daily. (Patient not taking: Reported on 08/12/2020)   No facility-administered encounter medications on file as of 07/13/2021.    Surgical History: Past Surgical History:  Procedure Laterality Date   LAPAROSCOPY N/A 05/24/2016   Procedure: LAPAROSCOPY DIAGNOSTIC;  Surgeon: Nadara Mustard, MD;  Location: [facility] ORS;  Service: Gynecology;  Laterality: N/A;   OVARIAN CYST SURGERY  2017    Medical History: Past Medical History:  Diagnosis Date   Acute vaginitis 09/26/2019   Anemia    H/O   Bacterial vaginitis 08/02/2019   Dyspareunia due to medical condition in female 05/24/2016   GERD (gastroesophageal reflux disease)    NO MEDS    Family History: Family History  Problem Relation Age of Onset   Diabetes Mother    Cancer Paternal Grandfather        STOMACH OR COLON NOT SURE   Thyroid disease Neg Hx      Social History   Socioeconomic History   Marital status: Single    Spouse name: Not on file   Number of children: 1   Years of education: Not on file   Highest education level: High school graduate  Occupational History   Occupation: ADMIN    Comment: [employer]  Tobacco Use   Smoking status: Never   Smokeless tobacco: Never  Vaping Use   Vaping Use: Never used  Substance and Sexual Activity   Alcohol use: Not Currently   Drug use: Not Currently    Types: Marijuana   Sexual activity: Yes    Comment: mirena-planning  Other Topics Concern   Not on file  Social History Narrative   Not on file   Social Determinants of Health   Financial Resource Strain: Not on file  Food Insecurity: Not on file  Transportation Needs: Not on file  Physical Activity: Not on file  Stress: Not on file  Social Connections: Not on file  Intimate Partner Violence: Not on file      Review of Systems  Constitutional:  Negative for chills, fatigue and unexpected weight change.  HENT:  Negative for congestion, rhinorrhea, sneezing and sore throat.   Eyes:  Negative for redness.  Respiratory:  Negative for cough, chest tightness and shortness of breath.   Cardiovascular:  Negative for chest pain and palpitations.  Gastrointestinal:  Positive for abdominal pain (umbilical, had a sharp pain where  her hernia is.). Negative for constipation, diarrhea, nausea and vomiting.  Genitourinary:  Negative for dysuria and frequency.  Musculoskeletal:  Negative for arthralgias, back pain, joint swelling and neck pain.  Skin:  Negative for rash.  Neurological: Negative.  Negative for tremors and numbness.  Hematological:  Negative for adenopathy. Does not bruise/bleed easily.  Psychiatric/Behavioral:  Negative for behavioral problems (Depression), sleep disturbance and suicidal ideas. The patient is not nervous/anxious.    Vital Signs: BP 114/82   Pulse 91   Temp 98.9 F (37.2 C)   Resp 16   Ht 5\' 7"   (1.702 m)   Wt 187 lb 9.6 oz (85.1 kg)   SpO2 99%   BMI 29.38 kg/m    Physical Exam Vitals reviewed.  Constitutional:      General: She is not in acute distress.    Appearance: Normal appearance. She is not ill-appearing.  HENT:     Head: Normocephalic and atraumatic.  Eyes:     Extraocular Movements: Extraocular movements intact.     Pupils: Pupils are equal, round, and reactive to light.  Cardiovascular:     Rate and Rhythm: Normal rate and regular rhythm.  Pulmonary:     Effort: Pulmonary effort is normal. No respiratory distress.  Abdominal:     Palpations: Abdomen is soft. There is no shifting dullness, fluid wave, mass or pulsatile mass.     Tenderness: There is no abdominal tenderness.     Hernia: A hernia is present. Hernia is present in the umbilical area.  Neurological:     Mental Status: She is alert and oriented to person, place, and time.     Cranial Nerves: No cranial nerve deficit.     Coordination: Coordination normal.     Gait: Gait normal.  Psychiatric:        Mood and Affect: Mood normal.        Behavior: Behavior normal.     Assessment/Plan: 1. Umbilical hernia without obstruction and without gangrene Refer to general surgery for evaluation . Patient wants to have hernia repaired if possible. - Ambulatory referral to General Surgery  2. Needs flu shot Administered during office visit today - Flu Vaccine MDCK QUAD PF   General Counseling: Rebecca Woods verbalizes understanding of the findings of todays visit and agrees with plan of treatment. I have discussed any further diagnostic evaluation that may be needed or ordered today. We also reviewed her medications today. she has been encouraged to call the office with any questions or concerns that should arise related to todays visit.    Orders Placed This Encounter  Procedures   Flu Vaccine MDCK QUAD PF   Ambulatory referral to General Surgery    No orders of the defined types were placed in this  encounter.   Return if symptoms worsen or fail to improve.   Total time spent:20 Minutes Time spent includes review of chart, medications, test results, and follow up plan with the patient.   [place] Controlled Substance Database was reviewed by me.  This patient was seen by , FNP-C in collaboration with Dr. Sallyanne Kuster as a part of collaborative care agreement.   Fianna Snowball R. Beverely Risen, MSN, FNP-C Internal medicine

## 2021-07-20 ENCOUNTER — Ambulatory Visit: Payer: Medicaid Other | Admitting: General Surgery

## 2021-07-20 ENCOUNTER — Encounter: Payer: Self-pay | Admitting: General Surgery

## 2021-07-20 ENCOUNTER — Telehealth: Payer: Self-pay | Admitting: General Surgery

## 2021-07-20 ENCOUNTER — Other Ambulatory Visit: Payer: Self-pay

## 2021-07-20 ENCOUNTER — Telehealth: Payer: Self-pay

## 2021-07-20 VITALS — BP 117/77 | HR 86 | Temp 98.2°F | Ht 67.0 in | Wt 186.6 lb

## 2021-07-20 DIAGNOSIS — K429 Umbilical hernia without obstruction or gangrene: Secondary | ICD-10-CM

## 2021-07-20 DIAGNOSIS — R1084 Generalized abdominal pain: Secondary | ICD-10-CM

## 2021-07-20 NOTE — Telephone Encounter (Signed)
Message left for patient to call back about her CT scan.  The patient is scheduled for a CT scan of the abdomen and pelvis with contrast. This is scheduled at [facility] on Tuesday October 11th. She will arrive at the [place] entrance by 7:45 am.  She will not be able to have any food or drink after midnight the night prior. She will need to pick up a prep kit at least 2 days before her exam.

## 2021-07-20 NOTE — Patient Instructions (Addendum)
We will get you set up for a CT scan. We will call you with this appointment.  We will call you with your results and if surgery is warranted we will get you set up for that.   You have requested for your Umbilical Hernia be repaired. If scheduled this will be done at [facility].   Please see your (blue)pre-care sheet for information.  You will need to arrange to be off work for 1-2 weeks but will have to have a lifting restriction of no more than 15 lbs for 6 weeks following your surgery.   Umbilical Hernia, Adult A hernia is a bulge of tissue that pushes through an opening between muscles. An umbilical hernia happens in the abdomen, near the belly button (umbilicus). The hernia may contain tissues from the small intestine, large intestine, or fatty tissue covering the intestines (omentum). Umbilical hernias in adults tend to get worse over time, and they require surgical treatment. There are several types of umbilical hernias. You may have: A hernia located just above or below the umbilicus (indirect hernia). This is the most common type of umbilical hernia in adults. A hernia that forms through an opening formed by the umbilicus (direct hernia). A hernia that comes and goes (reducible hernia). A reducible hernia may be visible only when you strain, lift something heavy, or cough. This type of hernia can be pushed back into the abdomen (reduced). A hernia that traps abdominal tissue inside the hernia (incarcerated hernia). This type of hernia cannot be reduced. A hernia that cuts off blood flow to the tissues inside the hernia (strangulated hernia). The tissues can start to die if this happens. This type of hernia requires emergency treatment.  What are the causes? An umbilical hernia happens when tissue inside the abdomen presses on a weak area of the abdominal muscles. What increases the risk? You may have a greater risk of this condition if you: Are obese. Have had  several pregnancies. Have a buildup of fluid inside your abdomen (ascites). Have had surgery that weakens the abdominal muscles.  What are the signs or symptoms? The main symptom of this condition is a painless bulge at or near the belly button. A reducible hernia may be visible only when you strain, lift something heavy, or cough. Other symptoms may include: Dull pain. A feeling of pressure.  Symptoms of a strangulated hernia may include: Pain that gets increasingly worse. Nausea and vomiting. Pain when pressing on the hernia. Skin over the hernia becoming red or purple. Constipation. Blood in the stool.  How is this diagnosed? This condition may be diagnosed based on: A physical exam. You may be asked to cough or strain while standing. These actions increase the pressure inside your abdomen and force the hernia through the opening in your muscles. Your health care provider may try to reduce the hernia by pressing on it. Your symptoms and medical history.  How is this treated? Surgery is the only treatment for an umbilical hernia. Surgery for a strangulated hernia is done as soon as possible. If you have a small hernia that is not incarcerated, you may need to lose weight before having surgery. Follow these instructions at home: Lose weight, if told by your health care provider. Do not try to push the hernia back in. Watch your hernia for any changes in color or size. Tell your health care provider if any changes occur. You may need to avoid activities that increase pressure on your hernia. Do  not lift anything that is heavier than 10 lb (4.5 kg) until your health care provider says that this is safe. Take over-the-counter and prescription medicines only as told by your health care provider. Keep all follow-up visits as told by your health care provider. This is important. Contact a health care provider if: Your hernia gets larger. Your hernia becomes painful. Get help right away  if: You develop sudden, severe pain near the area of your hernia. You have pain as well as nausea or vomiting. You have pain and the skin over your hernia changes color. You develop a fever. This information is not intended to replace advice given to you by your health care provider. Make sure you discuss any questions you have with your health care provider. Document Released: 03/16/2016 Document Revised: 06/17/2016 Document Reviewed: 03/16/2016 Elsevier Interactive Patient Education  [employer].

## 2021-07-20 NOTE — Telephone Encounter (Signed)
Patient returns call, she is given detailed information regarding her CT scan scheduled for 08/08/21 and verbalized understanding.

## 2021-07-20 NOTE — Progress Notes (Signed)
Patient ID: Rebecca Woods, female   DOB: [birth year], 25 y.o.   MRN: [id number]  Chief Complaint  Patient presents with   New Patient (Initial Visit)    Hernia     HPI Rebecca Woods is a 25 y.o. female.   She is here today to discuss possible repair of an umbilical hernia.  She states that at about 8 months into her most recent pregnancy, she noticed a bulge at her umbilicus.  After delivery of her child, the bulge resolved however she continues to endorse discomfort at the site.  She says that recently, she had a lot of pain when lifting her son, but on other occasions, such as the gym, she is able to lift without pain.  It is inconsistent and localized to the umbilicus without radiation.  She denies any nausea or vomiting.  No alterations in bowel function.  No imaging has been performed to date.  She did have a previous diagnostic laparoscopy for pelvic pain and dyspareunia in July 2017.  On review of that operative note, she did have a laparoscopic trocar placed at the umbilical location.   Past Medical History:  Diagnosis Date   Acute vaginitis 09/26/2019   Anemia    H/O   Bacterial vaginitis 08/02/2019   Dyspareunia due to medical condition in female 05/24/2016   GERD (gastroesophageal reflux disease)    NO MEDS    Past Surgical History:  Procedure Laterality Date   LAPAROSCOPY N/A 05/24/2016   Procedure: LAPAROSCOPY DIAGNOSTIC;  Surgeon: Nadara Mustard, MD;  Location: [facility] ORS;  Service: Gynecology;  Laterality: N/A;   OVARIAN CYST SURGERY  2017    Family History  Problem Relation Age of Onset   Diabetes Mother    Cancer Paternal Grandfather        STOMACH OR COLON NOT SURE   Thyroid disease Neg Hx     Social History Social History   Tobacco Use   Smoking status: Never   Smokeless tobacco: Never  Vaping Use   Vaping Use: Never used  Substance Use Topics   Alcohol use: Not Currently   Drug use: Not Currently    Types: Marijuana    No Known Allergies  Current  Outpatient Medications  Medication Sig Dispense Refill   norethindrone (MICRONOR) 0.35 MG tablet Take 1 tablet (0.35 mg total) by mouth daily. 84 tablet 4   No current facility-administered medications for this visit.    Review of Systems Review of Systems  Gastrointestinal:  Positive for abdominal pain.       GERD  All other systems reviewed and are negative. Or as discussed per the history of present illness.  Blood pressure 117/77, pulse 86, temperature 98.2 F (36.8 C), height 5\' 7"  (1.702 m), weight 186 lb 9.6 oz (84.6 kg), SpO2 96 %, unknown if currently breastfeeding. Body mass index is 29.23 kg/m.  Physical Exam Physical Exam Vitals reviewed.  Constitutional:      General: She is not in acute distress.    Appearance: Normal appearance.  HENT:     Head: Normocephalic and atraumatic.     Nose:     Comments: Covered with a mask    Mouth/Throat:     Comments: Covered with a mask Eyes:     General: No scleral icterus.       Right eye: No discharge.        Left eye: No discharge.  Neck:     Comments: No palpable cervical or supraclavicular lymphadenopathy.  The trachea is midline.  No thyromegaly or dominant thyroid masses appreciated.  The gland moves freely with deglutition. Cardiovascular:     Rate and Rhythm: Normal rate and regular rhythm.     Pulses: Normal pulses.  Pulmonary:     Effort: Pulmonary effort is normal.     Breath sounds: Normal breath sounds.  Abdominal:     Comments: She is tender to palpation right at the umbilicus, but I do not appreciate a bulge or fascial defect in this location.  Genitourinary:    Comments: Deferred Musculoskeletal:        General: No swelling or tenderness.  Skin:    General: Skin is warm and dry.  Neurological:     General: No focal deficit present.     Mental Status: She is alert and oriented to person, place, and time.  Psychiatric:        Mood and Affect: Mood normal.        Behavior: Behavior normal.    Data  Reviewed I reviewed the operative note from 2017 wherein Dr. Tiburcio Pea describes making an incision in the umbilical fold and placing a 5 mm laparoscopic trocar in this location.  I reviewed the electronic medical record for any abdominal imaging and none was identified.  There are no recent relevant laboratory studies available for review.  Assessment This is a 25 year old woman who developed a bulge at her umbilicus during a recent pregnancy.  The bulge has resolved, but she continues to endorse pain at this location.  She does have a prior surgical history that involves placement of a laparoscopic trocar at the umbilicus as well.  I am unable to appreciate a fascial defect or bulge on physical examination today.  Plan We will obtain a CT scan of the abdomen and pelvis to evaluate for possible hernia.  If 1 is identified, I have offered to repair this surgically.  I discussed the risks of the operation with the patient.  These include, but are not limited to, bleeding, infection, recurrence, failure to resolve symptoms, injury to bowel or other adjacent structures.  Once the scan has been performed, I will discussed the results with the patient and if surgery is indicated, we will work on getting that scheduled.    Rebecca Woods 07/20/2021, 2:29 PM

## 2021-08-08 ENCOUNTER — Ambulatory Visit: Payer: Medicaid Other

## 2021-08-16 ENCOUNTER — Encounter: Payer: Self-pay | Admitting: Surgery

## 2021-08-17 ENCOUNTER — Telehealth: Payer: Self-pay

## 2021-08-17 ENCOUNTER — Other Ambulatory Visit: Payer: Self-pay

## 2021-08-17 DIAGNOSIS — K429 Umbilical hernia without obstruction or gangrene: Secondary | ICD-10-CM

## 2021-08-17 NOTE — Telephone Encounter (Signed)
-----   Message from Henrene Dodge, MD sent at 08/17/2021  8:08 AM EDT ----- Regarding: U/S order Good morning!  This patient was seen by Victorino Dike for a possible umbilical hernia and a CT scan had been ordered for her.  However, her medical insurance denied it and after talking with him this week, they again denied the CT and would only approve an ultrasound as a first choice.  Can y'all please order an ultrasound of her abdomen, it can be limited to look at her umbilicus for suspected umbilical hernia.  The CT scan order that she currently has can be canceled.  If the ultrasound is equivocal or non-diagnostic, then they would only approve CT abdomen and not CT abdomen/pelvis.    Thanks!  [employer]

## 2021-08-17 NOTE — Telephone Encounter (Signed)
Call to patient and let her know what her insurance company said. We have canceled her CT and now have her scheduled for an Ultrasound of the abdomen at [facility] on October 31st at 10:00 am. She is to arrive there by 9:30 am and have nothing to eat or drink after midnight. We will call her with her results.

## 2021-08-24 ENCOUNTER — Ambulatory Visit: Payer: Medicaid Other

## 2021-08-25 ENCOUNTER — Ambulatory Visit: Payer: Medicaid Other

## 2021-08-25 NOTE — Telephone Encounter (Signed)
Mirena not rcvd. Patient rx'd POP's

## 2021-08-28 ENCOUNTER — Ambulatory Visit: Payer: BC Managed Care – PPO | Attending: Surgery

## 2021-09-28 ENCOUNTER — Other Ambulatory Visit: Payer: BC Managed Care – PPO | Admitting: Physician Assistant

## 2021-10-02 ENCOUNTER — Telehealth: Payer: Self-pay

## 2021-10-02 DIAGNOSIS — Z30011 Encounter for initial prescription of contraceptive pills: Secondary | ICD-10-CM

## 2021-10-02 MED ORDER — NORETHINDRONE 0.35 MG PO TABS: 1.0000 | ORAL_TABLET | Freq: Every day | ORAL | 0 refills | Status: DC

## 2021-10-02 NOTE — Telephone Encounter (Signed)
Patient is scheduled for 10/26/21 with JEG.

## 2021-10-02 NOTE — Telephone Encounter (Signed)
Pt was called to confirm pharm which is [facility] in [place]; aware refill eRx'd.

## 2021-10-02 NOTE — Telephone Encounter (Signed)
Pt calling; is unable to request refill of bc on MyChart - it says it's expired; doesn't know why.  [phone]

## 2021-10-17 IMAGING — CT CT VENOGRAM HEAD
2 of 9 series · 15 of 47 positions shown · IV contrast (APPLIED)
Comparison: Head CT 04/16/2017.

CLINICAL DATA: Dural venous sinus thrombosis suspected. Additional
history provided: Patient COVID positive in May 2019, recently
postpartum, numbness and weakness to right side of body and
bilateral lower extremities.

EXAM:
CT VENOGRAM HEAD
TECHNIQUE: Contiguous axial images were obtained from the base of the skull
through the vertex without intravenous contrast. Coronal and
sagittal reconstructions were also submitted. Subsequently, CT
venography of the head was performed. Coronal and sagittal thin
reconstructions were submitted. Additionally, axial, coronal and
sagittal thick MIP reconstructions were submitted.
CONTRAST:  75mL OMNIPAQUE IOHEXOL 350 MG/ML SOLN

[Series 4: cor soft · coronal · 0.28mm/px · 1 of 62 slices shown]
[im 31/62  brain]
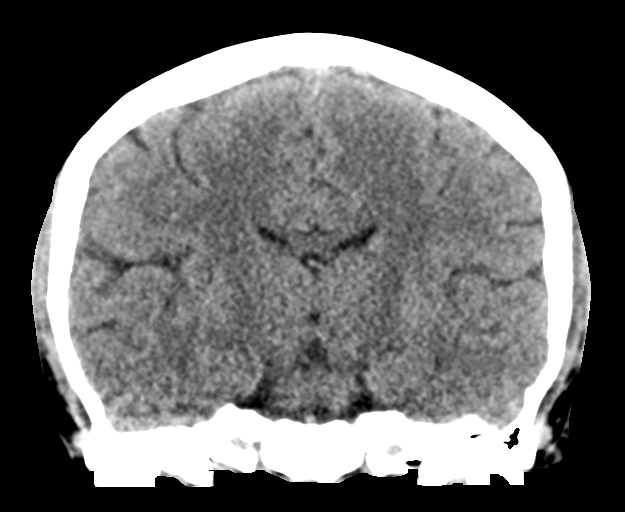

[Series 17: ax thin · axial · 0.36mm/px · z∈[+1279,+1407]mm · 14 of 148 slices shown]
[im 10/148  brain]
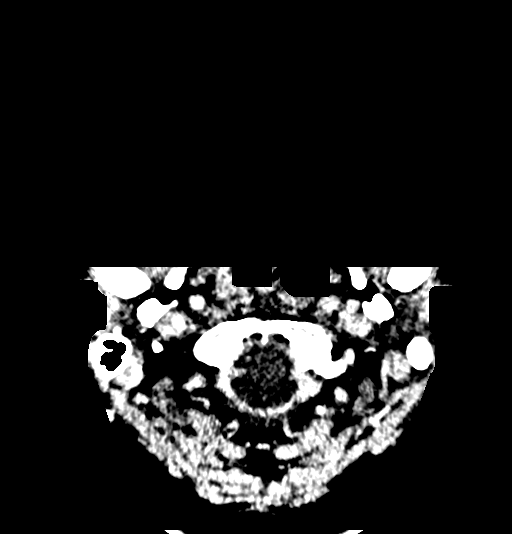
[im 20/148  bone]
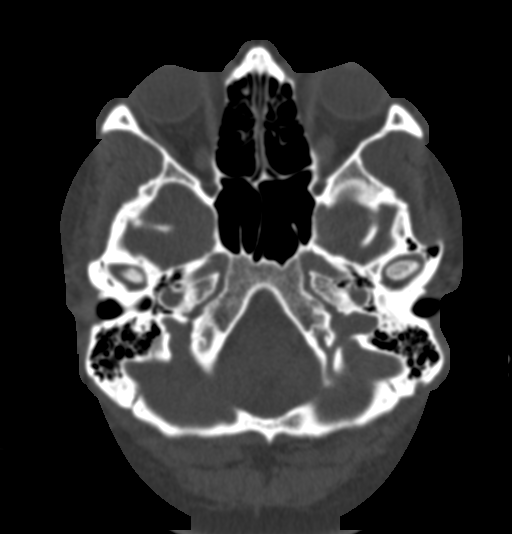
[im 30/148  brain]
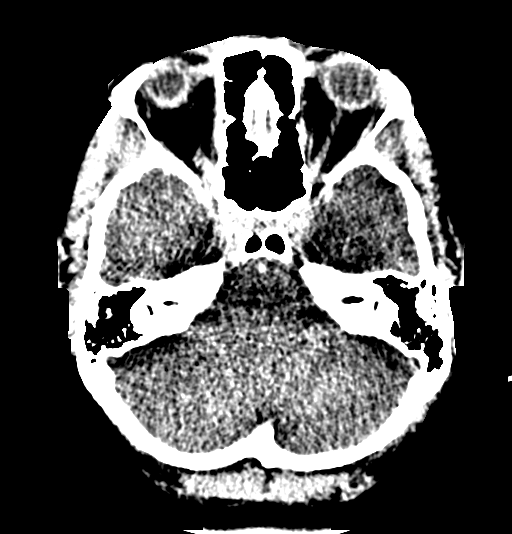
[im 40/148  bone]
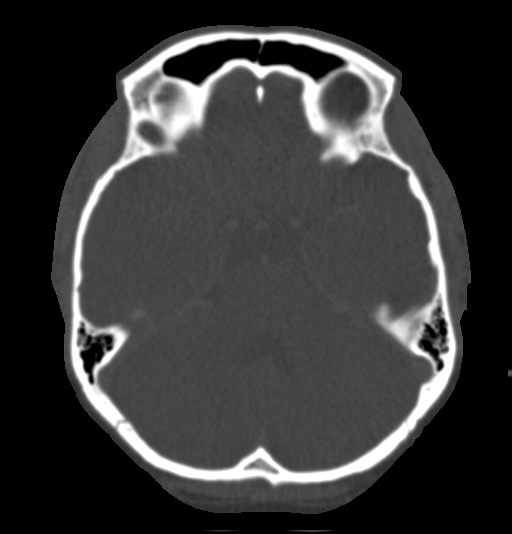
[im 50/148  brain]
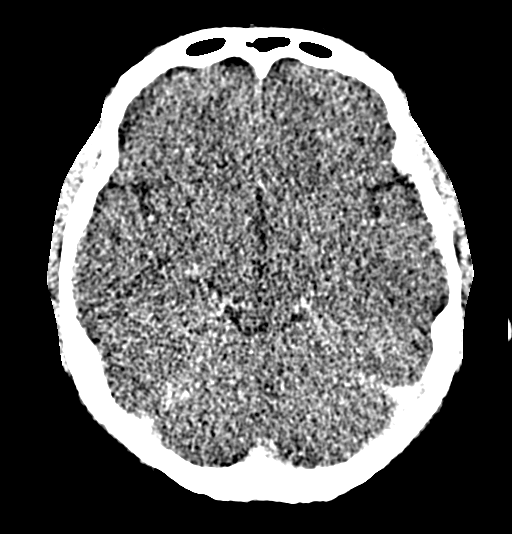
[im 59/148  bone]
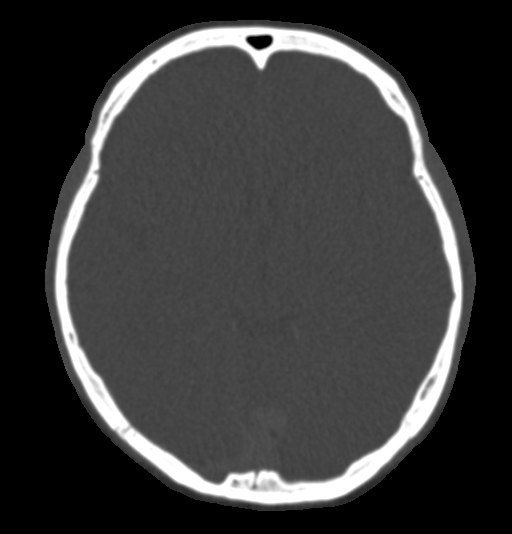
[im 69/148  brain]
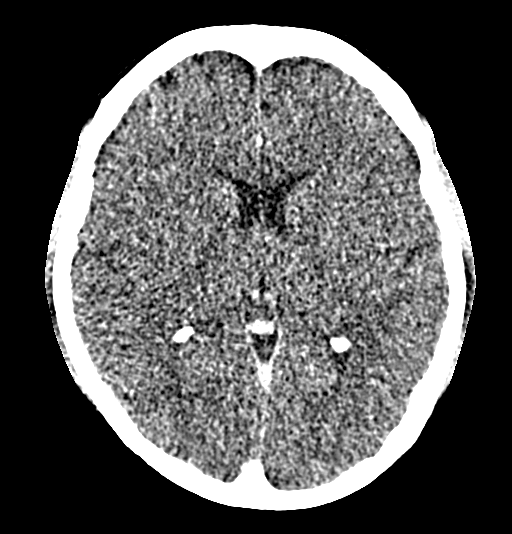
[im 79/148  bone]
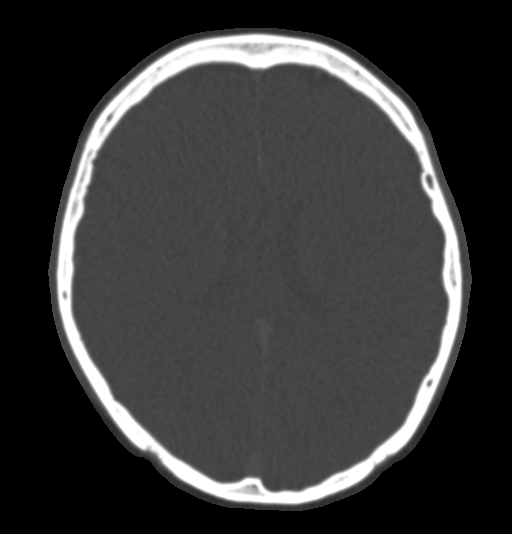
[im 89/148  brain]
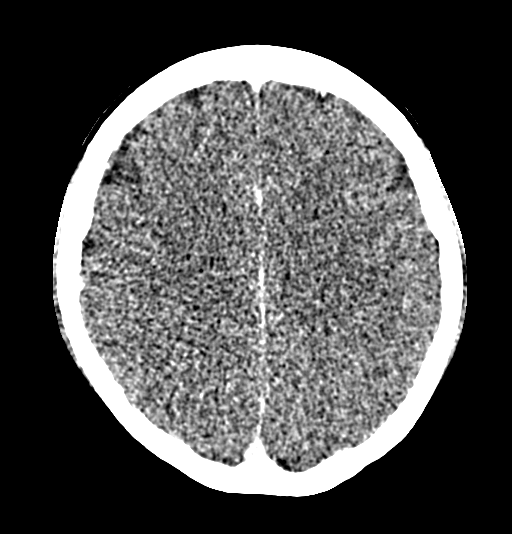
[im 99/148  bone]
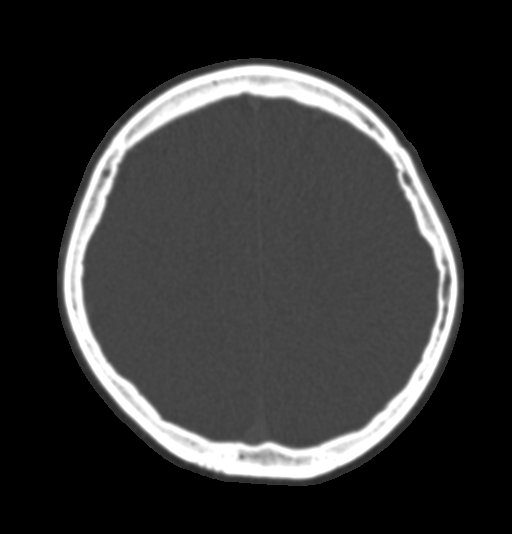
[im 108/148  brain]
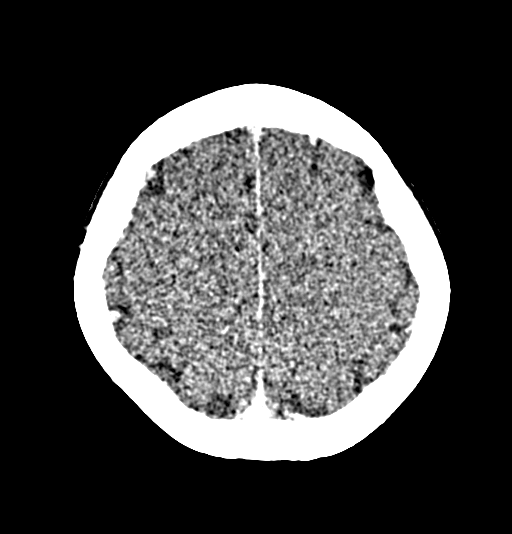
[im 118/148  bone]
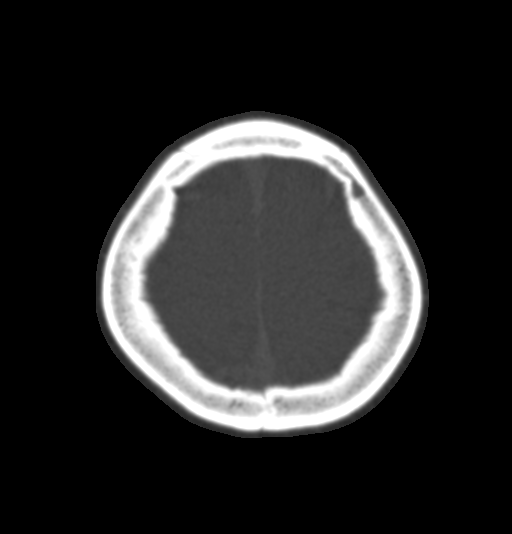
[im 128/148  brain]
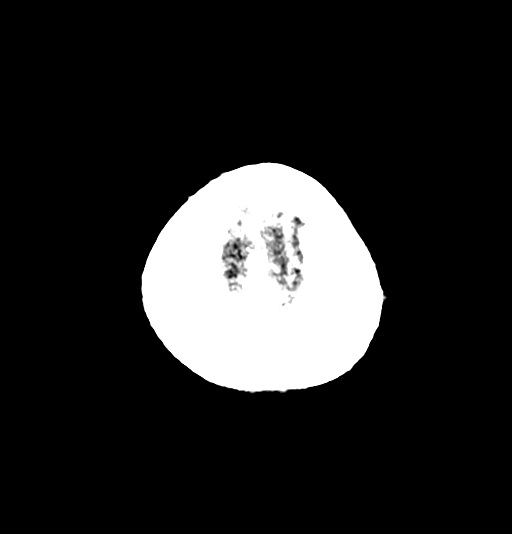
[im 138/148  bone]
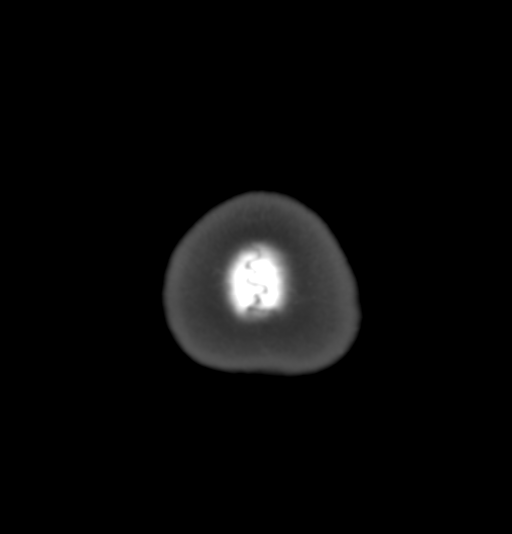

[15 of 47 positions shown; findings below may reference images not displayed]

FINDINGS: CT head:

Brain:

Cerebral volume is normal.

There is no acute intracranial hemorrhage.

No demarcated cortical infarct.

No extra-axial fluid collection.

No evidence of intracranial mass.

No midline shift.

Vascular: No hyperdense vessel.

Skull: Normal. Negative for fracture or focal lesion.

Sinuses/Orbits: Visualized orbits show no acute finding. No
significant paranasal sinus disease or mastoid effusion at the
imaged levels.

CT venography:

The superior sagittal sinus, internal cerebral veins, vein of Zentner,
straight sinus, transverse sinuses, sigmoid sinuses and visualized
jugular veins are patent. There is no appreciable intracranial
venous thrombosis.
IMPRESSION: CT head:

Unremarkable non-contrast CT appearance of the brain. No evidence of
acute intracranial abnormality.

CT venography head:

No evidence of intracranial venous thrombosis.

## 2021-10-17 IMAGING — MR MR CERVICAL SPINE WO/W CM
5 of 9 series · 27 of 48 positions shown · IV contrast (9ml Gadavist)
Comparison: Prior CT from earlier the same day.

CLINICAL DATA: Initial evaluation for possible demyelinating
disease.

EXAM:
MRI HEAD WITHOUT AND WITH CONTRAST
MRI CERVICAL SPINE WITHOUT AND WITH CONTRAST
TECHNIQUE: Multiplanar, multiecho pulse sequences of the brain and surrounding
structures, and cervical spine, to include the craniocervical
junction and cervicothoracic junction, were obtained without and
with intravenous contrast.
CONTRAST:  9mL GADAVIST GADOBUTROL 1 MMOL/ML IV SOLN

[Series 5: T2 · sagittal · 3.0mm · 0.62mm/px · 3 of 15 slices shown (1 of 2)]
[im 1/15]
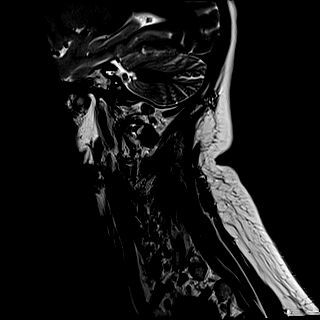
[im 8/15]
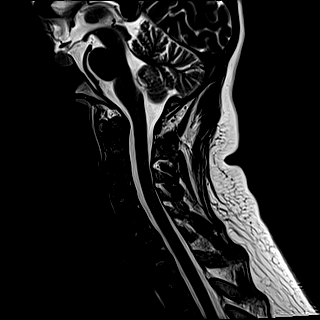
[im 15/15]
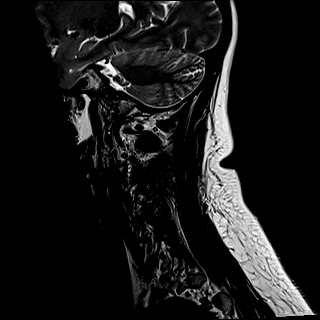

[Series 7: STIR · sagittal · 3.0mm · 0.62mm/px · 4 of 15 slices shown]
[im 1/15]
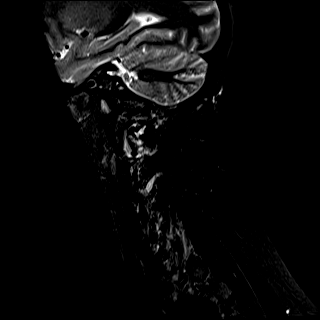
[im 5/15]
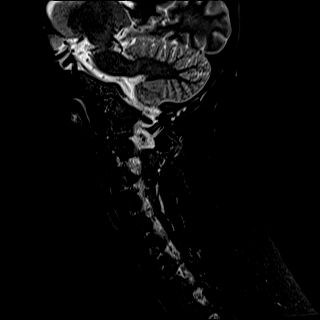
[im 10/15]
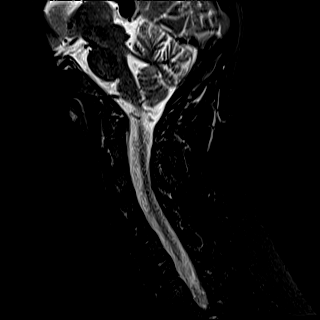
[im 15/15]
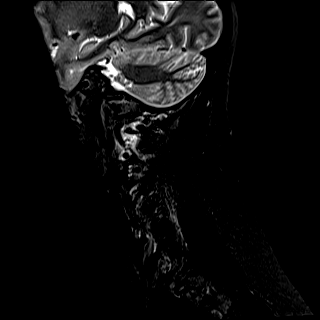

[Series 8: T2 · axial · 3.0mm · 0.70mm/px · z∈[-213,-119]mm · 7 of 29 slices shown (2 of 2)]
[im 1/29]
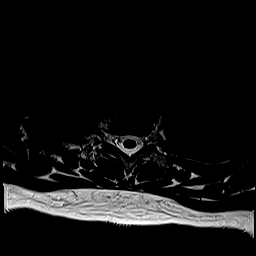
[im 5/29]
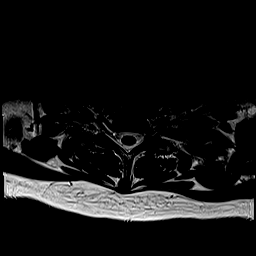
[im 10/29]
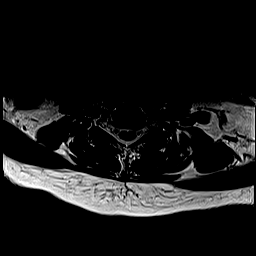
[im 15/29]
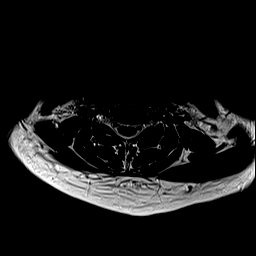
[im 19/29]
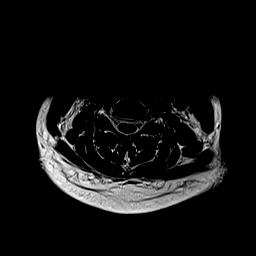
[im 24/29]
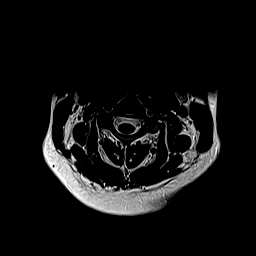
[im 29/29]
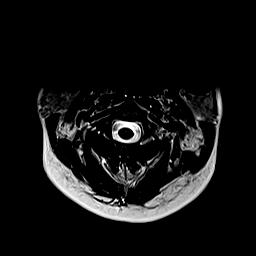

[Series 10: T1 · axial · non-contrast · 3.0mm · 0.35mm/px · z∈[-213,-119]mm · 7 of 29 slices shown]
[im 1/29]
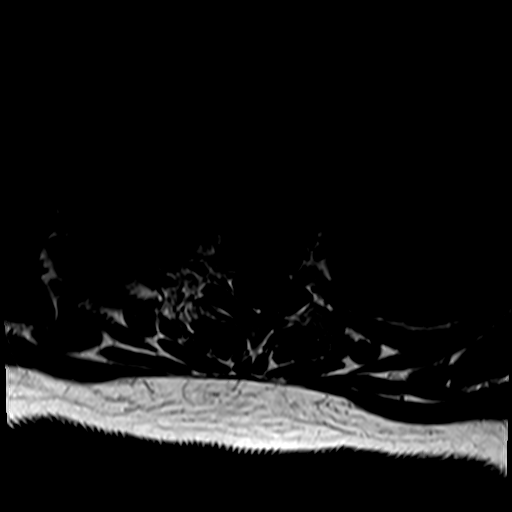
[im 5/29]
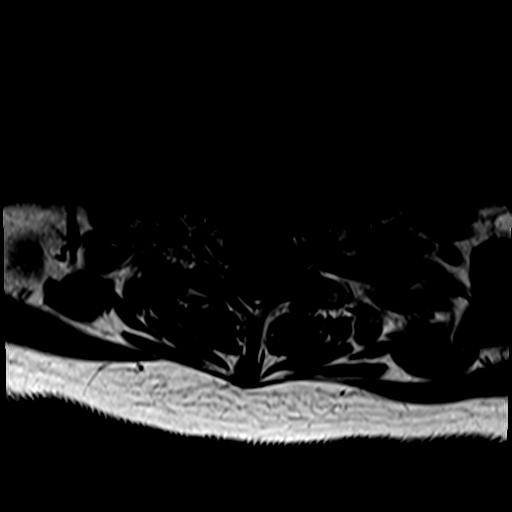
[im 10/29]
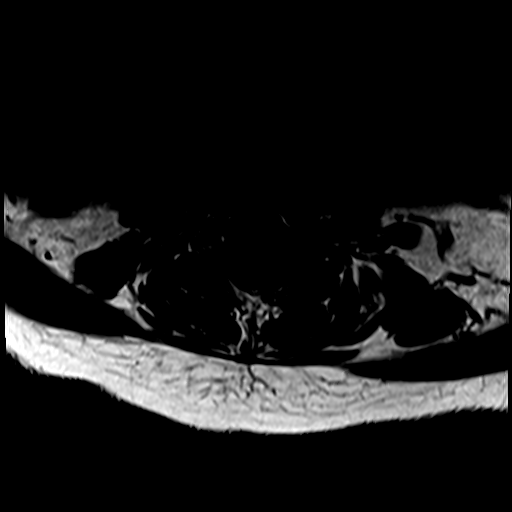
[im 15/29]
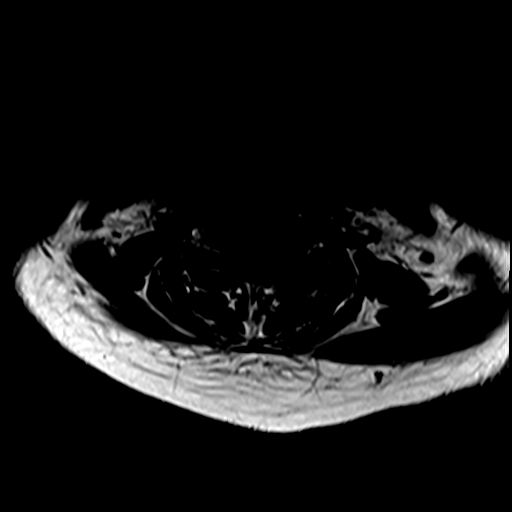
[im 19/29]
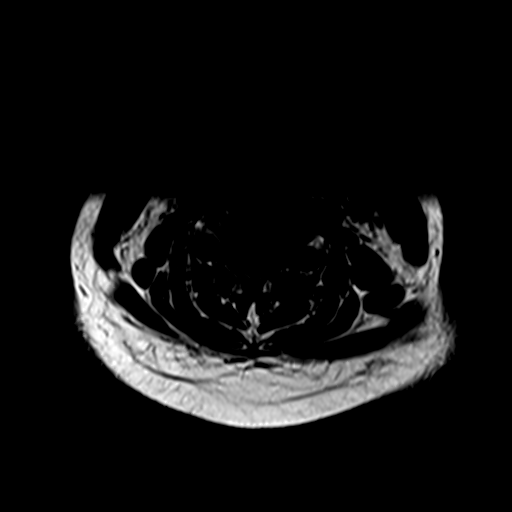
[im 24/29]
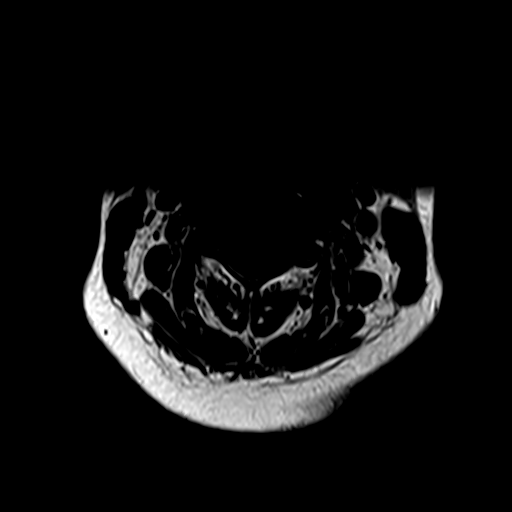
[im 29/29]
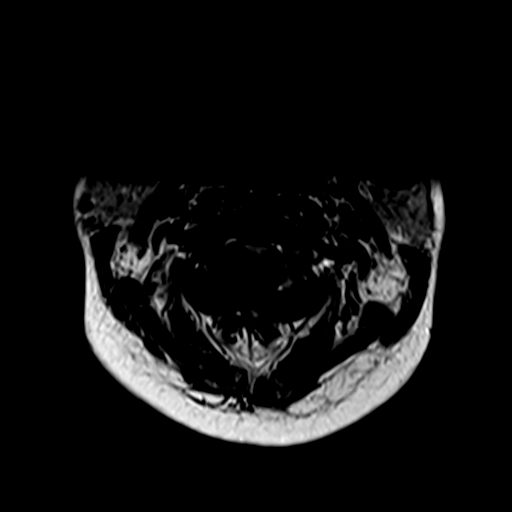

[Series 13: T1 post-contrast · axial · 3.0mm · 0.35mm/px · z∈[-213,-136]mm · 6 of 29 slices shown]
[im 1/29]
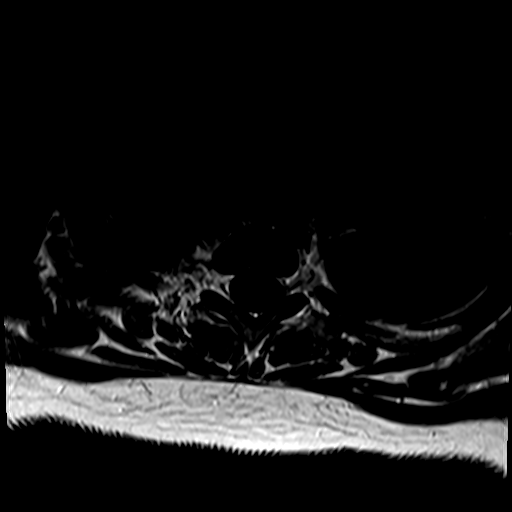
[im 5/29]
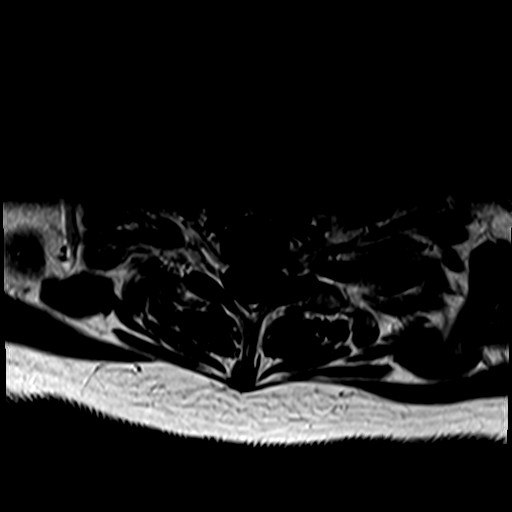
[im 10/29]
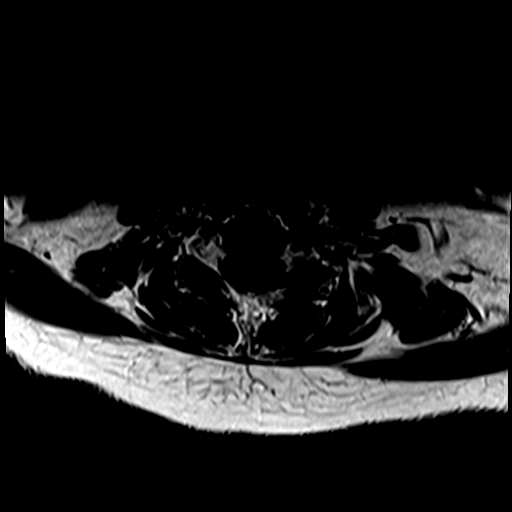
[im 15/29]
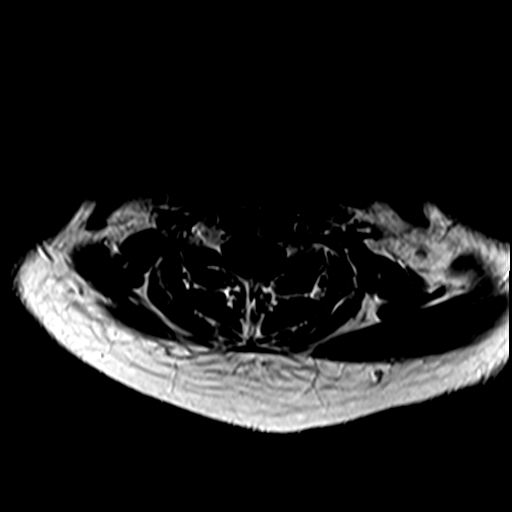
[im 19/29]
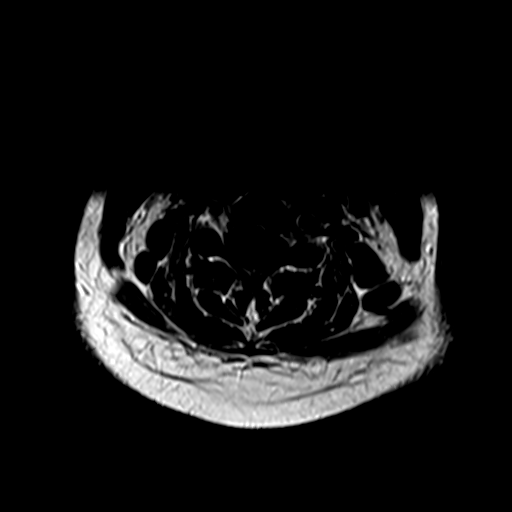
[im 24/29]
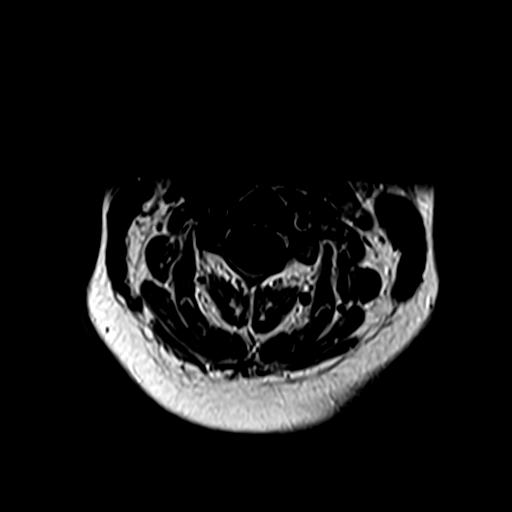

[27 of 48 positions shown; findings below may reference images not displayed]

FINDINGS: MRI HEAD FINDINGS

Brain: Cerebral volume within normal limits for age. No focal
parenchymal signal abnormality. No white matter changes to suggest
demyelinating disease.

No abnormal foci of restricted diffusion to suggest acute or
subacute ischemia. Gray-white matter differentiation maintained. No
encephalomalacia to suggest chronic cortical infarction. No foci of
susceptibility artifact to suggest acute or chronic intracranial
hemorrhage.

No mass lesion, midline shift or mass effect. No hydrocephalus or
extra-axial fluid collection. Pituitary gland prominent, compatible
with recent pregnancy. Midline structures intact and normal. No
abnormal enhancement.

Vascular: Major intracranial vascular flow voids are maintained.

Skull and upper cervical spine: Craniocervical junction within
normal limits. Bone marrow signal intensity normal. No scalp soft
tissue abnormality.

Sinuses/Orbits: Globes and orbital soft tissues within normal
limits. Few scattered maxillary sinus retention cyst noted, left
greater than right. Paranasal sinuses are otherwise clear. No
mastoid effusion. Inner ear structures grossly normal.

Other: None.

MRI CERVICAL SPINE FINDINGS

Alignment: Straightening of the normal cervical lordosis. No
listhesis.

Vertebrae: Vertebral body height maintained without acute or chronic
fracture. Bone marrow signal intensity within normal limits. No
discrete or worrisome osseous lesions. No abnormal marrow edema or
enhancement.

Cord: Signal intensity within the cervical spinal cord is normal.
Normal cord signal changes to suggest demyelinating disease. Normal
cord caliber morphology. No abnormal enhancement.

Posterior Fossa, vertebral arteries, paraspinal tissues: Negative.

Disc levels: No significant disc pathology seen within the cervical
spine. No disc bulge or focal disc herniation. No significant canal
or foraminal stenosis. No neural impingement.
IMPRESSION: Normal MRIs of the brain and cervical spine. No evidence for
demyelinating disease or other abnormality.

## 2021-10-26 ENCOUNTER — Other Ambulatory Visit (HOSPITAL_COMMUNITY)
Admission: RE | Admit: 2021-10-26 | Discharge: 2021-10-26 | Disposition: A | Discharge status: Home / Self Care | Payer: BC Managed Care – PPO | Source: Ambulatory Visit | Attending: Advanced Practice Midwife | Admitting: Advanced Practice Midwife

## 2021-10-26 ENCOUNTER — Other Ambulatory Visit: Payer: Self-pay

## 2021-10-26 ENCOUNTER — Encounter: Payer: Self-pay | Admitting: Advanced Practice Midwife

## 2021-10-26 ENCOUNTER — Ambulatory Visit (INDEPENDENT_AMBULATORY_CARE_PROVIDER_SITE_OTHER): Payer: BC Managed Care – PPO | Admitting: Advanced Practice Midwife

## 2021-10-26 VITALS — BP 112/70 | Ht 67.0 in | Wt 194.0 lb

## 2021-10-26 DIAGNOSIS — N898 Other specified noninflammatory disorders of vagina: Secondary | ICD-10-CM | POA: Diagnosis present

## 2021-10-26 DIAGNOSIS — Z3041 Encounter for surveillance of contraceptive pills: Secondary | ICD-10-CM

## 2021-10-26 MED ORDER — NORETHINDRONE 0.35 MG PO TABS: 1.0000 | ORAL_TABLET | Freq: Every day | ORAL | 3 refills | Status: DC

## 2021-10-26 NOTE — Progress Notes (Signed)
Patient ID: Rebecca Woods, female   DOB: 03-29-1996, 25 y.o.   MRN: [id number]  Reason for Consult: Gynecologic Exam    Subjective:  HPI:  Rebecca Woods is a 25 y.o. female being seen for vaginal itching and odor that has been present for the past week. She denies discharge or irritation or urinary symptoms. She had an annual exam with PAP smear in January of this year with Dr Chauncey Cruel [facility]). She was scheduled for an annual with me today. I informed the patient that insurance will not pay for 2 annuals in 1 year. She is also needing a refill of her birth control. Today's visit is gyn problem visit with refill of birth control.  Past Medical History:  Diagnosis Date   Acute vaginitis 09/26/2019   Anemia    H/O   Bacterial vaginitis 08/02/2019   Dyspareunia due to medical condition in female 05/24/2016   GERD (gastroesophageal reflux disease)    NO MEDS   Family History  Problem Relation Age of Onset   Diabetes Mother    Cancer Paternal Grandfather        STOMACH OR COLON NOT SURE   Thyroid disease Neg Hx    Past Surgical History:  Procedure Laterality Date   LAPAROSCOPY N/A 05/24/2016   Procedure: LAPAROSCOPY DIAGNOSTIC;  Surgeon: Gae Dry, MD;  Location: [facility] ORS;  Service: Gynecology;  Laterality: N/A;   OVARIAN CYST SURGERY  2017    Short Social History:  Social History   Tobacco Use   Smoking status: Never   Smokeless tobacco: Never  Substance Use Topics   Alcohol use: Not Currently    No Known Allergies  Current Outpatient Medications  Medication Sig Dispense Refill   norethindrone (MICRONOR) 0.35 MG tablet Take 1 tablet (0.35 mg total) by mouth daily. 84 tablet 3   No current facility-administered medications for this visit.   Review of Systems  Constitutional:  Negative for chills and fever.  HENT:  Negative for congestion, ear discharge, ear pain, hearing loss, sinus pain and sore throat.   Eyes:  Negative for blurred vision and double  vision.  Respiratory:  Negative for cough, shortness of breath and wheezing.   Cardiovascular:  Negative for chest pain, palpitations and leg swelling.  Gastrointestinal:  Negative for abdominal pain, blood in stool, constipation, diarrhea, heartburn, melena, nausea and vomiting.  Genitourinary:  Negative for dysuria, flank pain, frequency, hematuria and urgency.       Positive for vaginal itching and odor  Musculoskeletal:  Negative for back pain, joint pain and myalgias.  Skin:  Negative for itching and rash.  Neurological:  Negative for dizziness, tingling, tremors, sensory change, speech change, focal weakness, seizures, loss of consciousness, weakness and headaches.  Endo/Heme/Allergies:  Negative for environmental allergies. Does not bruise/bleed easily.  Psychiatric/Behavioral:  Negative for depression, hallucinations, memory loss, substance abuse and suicidal ideas. The patient is not nervous/anxious and does not have insomnia.        Objective:  Objective   Vitals:   10/26/21 0827  BP: 112/70  Weight: 194 lb (88 kg)  Height: 5\' 7"  (1.702 m)   Body mass index is 30.38 kg/m. Constitutional: Well nourished, well developed female in no acute distress.  HEENT: normal Skin: Warm and dry.  Cardiovascular: Regular rate and rhythm.   Extremity:  no edema   Respiratory: Clear to auscultation bilateral. Normal respiratory effort Neuro: DTRs 2+, Cranial nerves grossly intact Psych: Alert and Oriented x3. No memory deficits. Normal  mood and affect.    Pelvic exam:  is not limited by body habitus EGBUS: within normal limits Vagina: within normal limits and with normal mucosa Cervix: not evaluated    Assessment/Plan:     25 y.o. G2 P62 female with possible vaginitis, birth control refill  Aptima: vaginitis Follow up as needed after lab results and as needed Refill 1 year of birth control pills   Tresea Mall CNM [facility] 10/26/2021, 10:46 AM

## 2021-10-27 ENCOUNTER — Other Ambulatory Visit: Payer: Self-pay | Admitting: Advanced Practice Midwife

## 2021-10-27 DIAGNOSIS — B9689 Other specified bacterial agents as the cause of diseases classified elsewhere: Secondary | ICD-10-CM

## 2021-10-27 LAB — CERVICOVAGINAL ANCILLARY ONLY
Bacterial Vaginitis (gardnerella): POSITIVE — AB
Candida Glabrata: NEGATIVE
Candida Vaginitis: NEGATIVE
Comment: NEGATIVE
Comment: NEGATIVE
Comment: NEGATIVE

## 2021-10-27 MED ORDER — METRONIDAZOLE 0.75 % VA GEL: 1.0000 | Freq: Every day | VAGINAL | 1 refills | Status: AC

## 2021-10-27 NOTE — Progress Notes (Signed)
Rx metronidazole gel to treat BV. Message to patient.

## 2021-11-27 ENCOUNTER — Other Ambulatory Visit: Payer: Self-pay

## 2021-11-27 ENCOUNTER — Ambulatory Visit (INDEPENDENT_AMBULATORY_CARE_PROVIDER_SITE_OTHER): Payer: BC Managed Care – PPO | Admitting: Physician Assistant

## 2021-11-27 ENCOUNTER — Encounter: Payer: Self-pay | Admitting: Physician Assistant

## 2021-11-27 DIAGNOSIS — Z01419 Encounter for gynecological examination (general) (routine) without abnormal findings: Secondary | ICD-10-CM

## 2021-11-27 DIAGNOSIS — K429 Umbilical hernia without obstruction or gangrene: Secondary | ICD-10-CM

## 2021-11-27 DIAGNOSIS — R5383 Other fatigue: Secondary | ICD-10-CM | POA: Diagnosis not present

## 2021-11-27 DIAGNOSIS — R300 Dysuria: Secondary | ICD-10-CM | POA: Diagnosis not present

## 2021-11-27 DIAGNOSIS — Z0001 Encounter for general adult medical examination with abnormal findings: Secondary | ICD-10-CM

## 2021-11-27 NOTE — Progress Notes (Signed)
[facility] [address] [place], [place] [address]  Internal MEDICINE  Office Visit Note  Patient Name: Rebecca Woods  [id number]  [id number]  Date of Service: 11/29/2021  Chief Complaint  Patient presents with   Annual Exam   Gastroesophageal Reflux     HPI Pt is here for routine health maintenance examination -Had consult with general surgery for umbilical hernia, seen on CT. Unclear if medicaid will cover it so in a holding pattern for now. States it is only bothersome with certain activities, but is impacting her ability to exercise comfortably. 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   No facility-administered encounter medications on file as of 11/27/2021.    Surgical History: Past Surgical History:  Procedure Laterality Date   LAPAROSCOPY N/A 05/24/2016   Procedure: LAPAROSCOPY DIAGNOSTIC;  Surgeon: Nadara Mustard, MD;  Location: [facility] ORS;  Service: Gynecology;  Laterality: N/A;   OVARIAN CYST SURGERY  2017    Medical History: Past Medical History:  Diagnosis Date   Acute vaginitis 09/26/2019   Anemia    H/O   Bacterial vaginitis 08/02/2019   Dyspareunia due to medical condition in female 05/24/2016   GERD (gastroesophageal reflux disease)    NO MEDS    Family History: Family History  Problem Relation Age of Onset   Diabetes Mother    Cancer Paternal Grandfather        STOMACH OR COLON NOT SURE   Thyroid disease Neg Hx       Review of Systems  Constitutional:  Negative for chills, fatigue and unexpected  weight change.  HENT:  Negative for congestion, postnasal drip, rhinorrhea, sneezing and sore throat.   Eyes:  Negative for redness.  Respiratory:  Negative for cough, chest tightness and shortness of breath.   Cardiovascular:  Negative for chest pain and palpitations.  Gastrointestinal:  Positive for abdominal pain. Negative for constipation, diarrhea, nausea and vomiting.       Abdominal pain with certain movements around umbilical hernia  Genitourinary:  Negative for dysuria and frequency.  Musculoskeletal:  Negative for arthralgias, back pain, joint swelling and neck pain.  Skin:  Negative for rash.  Neurological: Negative.  Negative for tremors and numbness.  Hematological:  Negative for adenopathy. Does not bruise/bleed easily.  Psychiatric/Behavioral:  Negative for behavioral problems (Depression), sleep disturbance and suicidal ideas. The patient is not nervous/anxious.     Vital Signs: BP 111/70    Pulse 69    Temp 98 F (36.7 C)    Resp 16    Ht 5\' 7"  (1.702 m)    Wt 193 lb 12.8 oz (87.9 kg)    SpO2 98%    BMI 30.35 kg/m    Physical Exam Vitals and nursing note reviewed.  Constitutional:      General: She is not in acute distress.    Appearance: She is well-developed. She is obese. She is not diaphoretic.  HENT:     Head: Normocephalic and atraumatic.     Right Ear: External ear normal.     Left Ear: External ear normal.  Nose: Nose normal.     Mouth/Throat:     Pharynx: No oropharyngeal exudate.  Eyes:     General: No scleral icterus.       Right eye: No discharge.        Left eye: No discharge.     Conjunctiva/sclera: Conjunctivae normal.     Pupils: Pupils are equal, round, and reactive to light.  Neck:     Thyroid: No thyromegaly.     Vascular: No JVD.     Trachea: No tracheal deviation.  Cardiovascular:     Rate and Rhythm: Normal rate and regular rhythm.     Heart sounds: Normal heart sounds. No murmur heard.   No friction rub. No gallop.  Pulmonary:      Effort: Pulmonary effort is normal. No respiratory distress.     Breath sounds: Normal breath sounds. No stridor. No wheezing or rales.  Chest:     Chest wall: No tenderness.  Breasts:    Right: Normal. No mass.     Left: Normal. No mass.  Abdominal:     General: Bowel sounds are normal. There is no distension.     Palpations: Abdomen is soft. There is no mass.     Tenderness: There is no abdominal tenderness. There is no guarding or rebound.  Musculoskeletal:        General: No tenderness or deformity. Normal range of motion.     Cervical back: Normal range of motion and neck supple.  Lymphadenopathy:     Cervical: No cervical adenopathy.  Skin:    General: Skin is warm and dry.     Coloration: Skin is not pale.     Findings: No erythema or rash.  Neurological:     Mental Status: She is alert.     Cranial Nerves: No cranial nerve deficit.     Motor: No abnormal muscle tone.     Coordination: Coordination normal.     Deep Tendon Reflexes: Reflexes are normal and symmetric.  Psychiatric:        Behavior: Behavior normal.        Thought Content: Thought content normal.        Judgment: Judgment normal.     LABS: Recent Results (from the past 2160 hour(s))  Cervicovaginal ancillary only     Status: Abnormal   Collection Time: 10/26/21  8:58 AM  Result Value Ref Range   Bacterial Vaginitis (gardnerella) Positive (A)    Candida Vaginitis Negative    Candida Glabrata Negative    Comment      Normal Reference Range Bacterial Vaginosis - Negative   Comment Normal Reference Range Candida Species - Negative    Comment Normal Reference Range Candida Galbrata - Negative   UA/M w/rflx Culture, Routine     Status: Abnormal (Preliminary result)   Collection Time: 11/27/21  3:43 PM   Specimen: Urine   Urine  Result Value Ref Range   Specific Gravity, UA 1.021 1.005 - 1.030   pH, UA 6.5 5.0 - 7.5   Color, UA Yellow Yellow   Appearance Ur Cloudy (A) Clear   Leukocytes,UA 1+  (A) Negative   Protein,UA Negative Negative/Trace   Glucose, UA Negative Negative   Ketones, UA Negative Negative   RBC, UA Negative Negative   Bilirubin, UA Negative Negative   Urobilinogen, Ur 1.0 0.2 - 1.0 mg/dL   Nitrite, UA Negative Negative   Microscopic Examination See below:     Comment: Microscopic was indicated and was performed.  Urinalysis Reflex Comment     Comment: This specimen has reflexed to a Urine Culture.  Microscopic Examination     Status: Abnormal   Collection Time: 11/27/21  3:43 PM   Urine  Result Value Ref Range   WBC, UA 0-5 0 - 5 /hpf   RBC 3-10 (A) 0 - 2 /hpf   Epithelial Cells (non renal) >10 (A) 0 - 10 /hpf   Casts None seen None seen /lpf   Bacteria, UA Many (A) None seen/Few  Urine Culture, Reflex     Status: None (Preliminary result)   Collection Time: 11/27/21  3:43 PM   Urine  Result Value Ref Range   Urine Culture, Routine WILL FOLLOW         Assessment/Plan: 1. Encounter for general adult medical examination with abnormal findings CPE performed, routine fasting labs ordered  2. Umbilical hernia without obstruction and without gangrene Followed by general surgery, awaiting insurance approval   3. Other fatigue - CBC w/Diff/Platelet - Comprehensive metabolic panel - TSH + free T4 - Lipid Panel With LDL/HDL Ratio  4. Visit for gynecologic examination Breast exam performed  5. Dysuria - UA/M w/rflx Culture, Routine - Microscopic Examination - Urine Culture, Reflex   General Counseling: Ethne verbalizes understanding of the findings of todays visit and agrees with plan of treatment. I have discussed any further diagnostic evaluation that may be needed or ordered today. We also reviewed her medications today. she has been encouraged to call the office with any questions or concerns that should arise related to todays visit.    Counseling:    Orders Placed This Encounter  Procedures   Microscopic Examination   Urine  Culture, Reflex   UA/M w/rflx Culture, Routine   CBC w/Diff/Platelet   Comprehensive metabolic panel   TSH + free T4   Lipid Panel With LDL/HDL Ratio    No orders of the defined types were placed in this encounter.   This patient was seen by Lynn Ito, PA-C in collaboration with Dr. Beverely Risen as a part of collaborative care agreement.  Total time spent:30 Minutes  Time spent includes review of chart, medications, test results, and follow up plan with the patient.     Lyndon Code, MD  Internal Medicine

## 2021-11-29 ENCOUNTER — Encounter: Payer: Self-pay | Admitting: Physician Assistant

## 2021-11-30 LAB — UA/M W/RFLX CULTURE, ROUTINE
Bilirubin, UA: NEGATIVE
Glucose, UA: NEGATIVE
Ketones, UA: NEGATIVE
Nitrite, UA: NEGATIVE
Protein,UA: NEGATIVE
RBC, UA: NEGATIVE
Specific Gravity, UA: 1.021 (ref 1.005–1.030)
Urobilinogen, Ur: 1 mg/dL (ref 0.2–1.0)
pH, UA: 6.5 (ref 5.0–7.5)

## 2021-11-30 LAB — URINE CULTURE, REFLEX

## 2021-11-30 LAB — MICROSCOPIC EXAMINATION
Casts: NONE SEEN /lpf
Epithelial Cells (non renal): >10 /hpf — AB (ref 0–10)

## 2021-12-06 ENCOUNTER — Other Ambulatory Visit: Payer: Self-pay

## 2021-12-06 MED ORDER — NITROFURANTOIN MONOHYD MACRO 100 MG PO CAPS: 100.0000 mg | ORAL_CAPSULE | Freq: Two times a day (BID) | ORAL | 1 refills | Status: DC

## 2021-12-06 NOTE — Telephone Encounter (Signed)
Spoke to pt and informed her that she has a UTI and per Lauren we sent in Macrobid twice a day for 10 days

## 2022-07-30 ENCOUNTER — Other Ambulatory Visit: Payer: Self-pay | Admitting: Physician Assistant

## 2022-07-30 ENCOUNTER — Telehealth: Payer: Self-pay | Admitting: Physician Assistant

## 2022-07-30 DIAGNOSIS — Z0001 Encounter for general adult medical examination with abnormal findings: Secondary | ICD-10-CM

## 2022-07-30 DIAGNOSIS — R5383 Other fatigue: Secondary | ICD-10-CM

## 2022-08-02 LAB — COMPREHENSIVE METABOLIC PANEL
ALT: 19 IU/L (ref 0–32)
AST: 20 IU/L (ref 0–40)
Albumin/Globulin Ratio: 1.7 (ref 1.2–2.2)
Albumin: 4.3 g/dL (ref 4.0–5.0)
Alkaline Phosphatase: 130 IU/L — ABNORMAL HIGH (ref 44–121)
BUN/Creatinine Ratio: 11 (ref 9–23)
BUN: 9 mg/dL (ref 6–20)
Bilirubin Total: 0.3 mg/dL (ref 0.0–1.2)
CO2: 20 mmol/L (ref 20–29)
Calcium: 9.3 mg/dL (ref 8.7–10.2)
Chloride: 101 mmol/L (ref 96–106)
Creatinine, Ser: 0.81 mg/dL (ref 0.57–1.00)
Globulin, Total: 2.6 g/dL (ref 1.5–4.5)
Glucose: 82 mg/dL (ref 70–99)
Potassium: 4.2 mmol/L (ref 3.5–5.2)
Sodium: 137 mmol/L (ref 134–144)
Total Protein: 6.9 g/dL (ref 6.0–8.5)
eGFR: 103 mL/min/{1.73_m2} (ref >59)

## 2022-08-02 LAB — CBC WITH DIFFERENTIAL/PLATELET
Basophils Absolute: 0 10*3/uL (ref 0.0–0.2)
Basos: 1 %
EOS (ABSOLUTE): 0.1 10*3/uL (ref 0.0–0.4)
Eos: 1 %
Hematocrit: 38.8 % (ref 34.0–46.6)
Hemoglobin: 12.8 g/dL (ref 11.1–15.9)
Immature Grans (Abs): 0 10*3/uL (ref 0.0–0.1)
Immature Granulocytes: 0 %
Lymphocytes Absolute: 2.1 10*3/uL (ref 0.7–3.1)
Lymphs: 34 %
MCH: 26.9 pg (ref 26.6–33.0)
MCHC: 33 g/dL (ref 31.5–35.7)
MCV: 82 fL (ref 79–97)
Monocytes Absolute: 0.3 10*3/uL (ref 0.1–0.9)
Monocytes: 5 %
Neutrophils Absolute: 3.5 10*3/uL (ref 1.4–7.0)
Neutrophils: 59 %
Platelets: 263 10*3/uL (ref 150–450)
RBC: 4.75 x10E6/uL (ref 3.77–5.28)
RDW: 13.4 % (ref 11.7–15.4)
WBC: 6 10*3/uL (ref 3.4–10.8)

## 2022-08-02 LAB — LIPID PANEL WITH LDL/HDL RATIO
Cholesterol, Total: 155 mg/dL (ref 100–199)
HDL: 45 mg/dL (ref >39)
LDL Chol Calc (NIH): 92 mg/dL (ref 0–99)
LDL/HDL Ratio: 2 ratio (ref 0.0–3.2)
Triglycerides: 96 mg/dL (ref 0–149)
VLDL Cholesterol Cal: 18 mg/dL (ref 5–40)

## 2022-08-02 LAB — TSH+FREE T4
Free T4: 1.13 ng/dL (ref 0.82–1.77)
TSH: 2.5 u[IU]/mL (ref 0.450–4.500)

## 2022-08-22 ENCOUNTER — Telehealth: Payer: Self-pay | Admitting: Physician Assistant

## 2022-08-22 NOTE — Telephone Encounter (Signed)
Lvm and sent mychart message notifying patient that 11/15/22 has been moved to 11/29/22-toni

## 2022-08-23 NOTE — Telephone Encounter (Signed)
Error

## 2022-08-30 ENCOUNTER — Encounter: Payer: Self-pay | Admitting: Physician Assistant

## 2022-08-30 ENCOUNTER — Ambulatory Visit (INDEPENDENT_AMBULATORY_CARE_PROVIDER_SITE_OTHER): Payer: BC Managed Care – PPO | Admitting: Physician Assistant

## 2022-08-30 VITALS — BP 112/79 | HR 83 | Temp 98.4°F | Resp 16 | Ht 67.0 in | Wt 198.4 lb

## 2022-08-30 DIAGNOSIS — L02224 Furuncle of groin: Secondary | ICD-10-CM

## 2022-08-30 MED ORDER — DOXYCYCLINE HYCLATE 100 MG PO TABS: 100.0000 mg | ORAL_TABLET | Freq: Two times a day (BID) | ORAL | 0 refills | Status: DC

## 2022-08-30 NOTE — Progress Notes (Signed)
[facility] [place], [place] [address]  Internal MEDICINE  Office Visit Note  Patient Name: Rebecca Woods  [id number]  [id number]  Date of Service: 08/30/2022  Chief Complaint  Patient presents with   Skin Problem    Patient noticed boil forming in between pelvic area and upper inner thigh after finishing up her cycle last week     HPI Pt is here for a sick visit. -Boil along right side of groin started about a week ago.  -Has gotten these before and usually go away on their own, but this one seems worse and wouldn't go away. Has been keeping area clean and using warm compresses. Feels a bump under skin and has been draining some.  Current Medication:  Outpatient Encounter Medications as of 08/30/2022  Medication Sig   doxycycline (VIBRA-TABS) 100 MG tablet Take 1 tablet (100 mg total) by mouth 2 (two) times daily.   norethindrone (MICRONOR) 0.35 MG tablet Take 1 tablet (0.35 mg total) by mouth daily.   [DISCONTINUED] nitrofurantoin, macrocrystal-monohydrate, (MACROBID) 100 MG capsule Take 1 capsule (100 mg total) by mouth 2 (two) times daily.   No facility-administered encounter medications on file as of 08/30/2022.      Medical History: Past Medical History:  Diagnosis Date   Acute vaginitis 09/26/2019   Anemia    H/O   Bacterial vaginitis 08/02/2019   Dyspareunia due to medical condition in female 05/24/2016   GERD (gastroesophageal reflux disease)    NO MEDS     Vital Signs: BP 112/79   Pulse 83   Temp 98.4 F (36.9 C)   Resp 16   Ht 5\' 7"  (1.702 m)   Wt 198 lb 6.4 oz (90 kg)   SpO2 97%   BMI 31.07 kg/m    Review of Systems  Constitutional:  Negative for fatigue and fever.  HENT:  Negative for congestion, mouth sores and postnasal drip.   Respiratory:  Negative for cough.   Cardiovascular:  Negative for chest pain.  Genitourinary:  Negative for flank pain.  Skin:  Positive for wound.       Boil in groin   Psychiatric/Behavioral: Negative.      Physical Exam Vitals and nursing note reviewed.  Constitutional:      General: She is not in acute distress.    Appearance: Normal appearance. She is well-developed. She is obese. She is not diaphoretic.  HENT:     Head: Normocephalic and atraumatic.     Mouth/Throat:     Pharynx: No oropharyngeal exudate.  Eyes:     Pupils: Pupils are equal, round, and reactive to light.  Neck:     Thyroid: No thyromegaly.     Vascular: No JVD.     Trachea: No tracheal deviation.  Cardiovascular:     Rate and Rhythm: Normal rate and regular rhythm.     Heart sounds: Normal heart sounds.  Pulmonary:     Effort: Pulmonary effort is normal.  Genitourinary:   Musculoskeletal:        General: Normal range of motion.     Cervical back: Normal range of motion and neck supple.  Skin:    General: Skin is warm and dry.     Findings: Lesion present.     Comments: Boil along right side of groin, open but not actively draining at this time  Neurological:     Mental Status: She is alert and oriented to person, place, and time.     Cranial  Nerves: No cranial nerve deficit.  Psychiatric:        Behavior: Behavior normal.        Thought Content: Thought content normal.        Judgment: Judgment normal.       Assessment/Plan: 1. Boil of groin Continue warm compresses and keep area clean. Will start on doxycycline BID. Advised to call if new or worsening symptoms. - doxycycline (VIBRA-TABS) 100 MG tablet; Take 1 tablet (100 mg total) by mouth 2 (two) times daily.  Dispense: 14 tablet; Refill: 0   General Counseling: Rebecca Woods verbalizes understanding of the findings of todays visit and agrees with plan of treatment. I have discussed any further diagnostic evaluation that may be needed or ordered today. We also reviewed her medications today. she has been encouraged to call the office with any questions or concerns that should arise related to todays  visit.    Counseling:    No orders of the defined types were placed in this encounter.   Meds ordered this encounter  Medications   doxycycline (VIBRA-TABS) 100 MG tablet    Sig: Take 1 tablet (100 mg total) by mouth 2 (two) times daily.    Dispense:  14 tablet    Refill:  0    Time spent:30 Minutes

## 2022-10-17 ENCOUNTER — Other Ambulatory Visit: Payer: Self-pay

## 2022-11-15 ENCOUNTER — Encounter: Payer: BC Managed Care – PPO | Admitting: Physician Assistant

## 2022-11-29 ENCOUNTER — Encounter: Payer: BC Managed Care – PPO | Admitting: Physician Assistant

## 2022-12-17 ENCOUNTER — Encounter: Payer: BC Managed Care – PPO | Admitting: Physician Assistant

## 2022-12-19 ENCOUNTER — Telehealth: Payer: Self-pay | Admitting: Physician Assistant

## 2022-12-19 NOTE — Telephone Encounter (Signed)
Lvm to move 01/22/23 appointment to another day-Toni

## 2022-12-24 ENCOUNTER — Encounter: Payer: Self-pay | Admitting: Physician Assistant

## 2022-12-24 ENCOUNTER — Ambulatory Visit (INDEPENDENT_AMBULATORY_CARE_PROVIDER_SITE_OTHER): Payer: BC Managed Care – PPO | Admitting: Physician Assistant

## 2022-12-24 VITALS — BP 112/70 | HR 87 | Temp 98.4°F | Resp 16 | Ht 67.0 in | Wt 203.6 lb

## 2022-12-24 DIAGNOSIS — Z0001 Encounter for general adult medical examination with abnormal findings: Secondary | ICD-10-CM

## 2022-12-24 DIAGNOSIS — R300 Dysuria: Secondary | ICD-10-CM

## 2022-12-24 NOTE — Progress Notes (Signed)
[facility] [place], [place] [address]  Internal MEDICINE  Office Visit Note  Patient Name: Rebecca Woods  [id number]  [id number]  Date of Service: 12/28/2022  Chief Complaint  Patient presents with   Annual Exam   Gastroesophageal Reflux     HPI Pt is here for routine health maintenance examination, her son is with her -No concerns today and reports she is doing well -Sleeping well -Exercising regularly -boils cleared up with doxycycline last visit -pap done in 2022, due next year  Current Medication: Outpatient Encounter Medications as of 12/24/2022  Medication Sig   norethindrone (MICRONOR) 0.35 MG tablet Take 1 tablet (0.35 mg total) by mouth daily.   [DISCONTINUED] doxycycline (VIBRA-TABS) 100 MG tablet Take 1 tablet (100 mg total) by mouth 2 (two) times daily.   No facility-administered encounter medications on file as of 12/24/2022.    Surgical History: Past Surgical History:  Procedure Laterality Date   LAPAROSCOPY N/A 05/24/2016   Procedure: LAPAROSCOPY DIAGNOSTIC;  Surgeon: Gae Dry, MD;  Location: [facility] ORS;  Service: Gynecology;  Laterality: N/A;   OVARIAN CYST SURGERY  2017    Medical History: Past Medical History:  Diagnosis Date   Acute vaginitis 09/26/2019   Anemia    H/O   Bacterial vaginitis 08/02/2019   Dyspareunia due to medical condition in female 05/24/2016   GERD (gastroesophageal reflux disease)    NO MEDS    Family History: Family History  Problem Relation Age of Onset   Diabetes Mother    Cancer Paternal Grandfather        STOMACH OR COLON NOT SURE   Thyroid disease Neg Hx       Review of Systems  Constitutional:  Negative for chills, fatigue and unexpected weight change.  HENT:  Negative for congestion, postnasal drip, rhinorrhea, sneezing and sore throat.   Eyes:  Negative for redness.  Respiratory:  Negative for cough, chest tightness and shortness of breath.   Cardiovascular:  Negative for  chest pain and palpitations.  Gastrointestinal:  Negative for abdominal pain, constipation, diarrhea, nausea and vomiting.  Genitourinary:  Negative for dysuria and frequency.  Musculoskeletal:  Negative for arthralgias, back pain, joint swelling and neck pain.  Skin:  Negative for rash.  Neurological: Negative.  Negative for tremors and numbness.  Hematological:  Negative for adenopathy. Does not bruise/bleed easily.  Psychiatric/Behavioral:  Negative for behavioral problems (Depression), sleep disturbance and suicidal ideas. The patient is not nervous/anxious.      Vital Signs: BP 112/70   Pulse 87   Temp 98.4 F (36.9 C)   Resp 16   Ht '5\' 7"'$  (1.702 m)   Wt 203 lb 9.6 oz (92.4 kg)   SpO2 98%   BMI 31.89 kg/m    Physical Exam Vitals and nursing note reviewed.  Constitutional:      General: She is not in acute distress.    Appearance: She is well-developed. She is obese. She is not diaphoretic.  HENT:     Head: Normocephalic and atraumatic.     Right Ear: External ear normal.     Left Ear: External ear normal.     Nose: Nose normal.     Mouth/Throat:     Pharynx: No oropharyngeal exudate.  Eyes:     Conjunctiva/sclera: Conjunctivae normal.     Pupils: Pupils are equal, round, and reactive to light.  Neck:     Thyroid: No thyromegaly.     Vascular: No JVD.  Trachea: No tracheal deviation.  Cardiovascular:     Rate and Rhythm: Normal rate and regular rhythm.     Heart sounds: Normal heart sounds. No murmur heard.    No friction rub. No gallop.  Pulmonary:     Effort: Pulmonary effort is normal. No respiratory distress.     Breath sounds: Normal breath sounds. No stridor. No wheezing or rales.  Chest:     Chest wall: No tenderness.  Breasts:    Right: Normal. No mass.     Left: Normal. No mass.  Abdominal:     General: Bowel sounds are normal.     Palpations: Abdomen is soft.     Tenderness: There is no abdominal tenderness.  Musculoskeletal:         General: No tenderness or deformity. Normal range of motion.     Cervical back: Normal range of motion and neck supple.  Lymphadenopathy:     Cervical: No cervical adenopathy.  Skin:    General: Skin is warm and dry.     Coloration: Skin is not pale.     Findings: No erythema or rash.  Neurological:     Mental Status: She is alert.     Cranial Nerves: No cranial nerve deficit.     Motor: No abnormal muscle tone.     Coordination: Coordination normal.     Deep Tendon Reflexes: Reflexes are normal and symmetric.  Psychiatric:        Behavior: Behavior normal.        Thought Content: Thought content normal.        Judgment: Judgment normal.      LABS: Recent Results (from the past 2160 hour(s))  UA/M w/rflx Culture, Routine     Status: Abnormal   Collection Time: 12/24/22  3:43 PM   Specimen: Urine   Urine  Result Value Ref Range   Specific Gravity, UA 1.026 1.005 - 1.030   pH, UA 5.5 5.0 - 7.5   Color, UA Yellow Yellow   Appearance Ur Turbid (A) Clear   Leukocytes,UA Negative Negative   Protein,UA Negative Negative/Trace   Glucose, UA Negative Negative   Ketones, UA Negative Negative   RBC, UA Negative Negative   Bilirubin, UA Negative Negative   Urobilinogen, Ur 0.2 0.2 - 1.0 mg/dL   Nitrite, UA Negative Negative   Microscopic Examination Comment     Comment: Microscopic follows if indicated.   Microscopic Examination See below:     Comment: Microscopic was indicated and was performed.   Urinalysis Reflex Comment     Comment: This specimen will not reflex to a Urine Culture.  Microscopic Examination     Status: None   Collection Time: 12/24/22  3:43 PM   Urine  Result Value Ref Range   WBC, UA 0-5 0 - 5 /hpf   RBC, Urine 0-2 0 - 2 /hpf   Epithelial Cells (non renal) 0-10 0 - 10 /hpf   Casts None seen None seen /lpf   Bacteria, UA Few None seen/Few       Assessment/Plan: 1. Encounter for general adult medical examination with abnormal findings CPE  performed, labs previously reviewed, UTD on PHM--due for pap next year  2. Dysuria - UA/M w/rflx Culture, Routine   General Counseling: Ronie verbalizes understanding of the findings of todays visit and agrees with plan of treatment. I have discussed any further diagnostic evaluation that may be needed or ordered today. We also reviewed her medications today. she has been encouraged  to call the office with any questions or concerns that should arise related to todays visit.    Counseling:    Orders Placed This Encounter  Procedures   Microscopic Examination   UA/M w/rflx Culture, Routine    No orders of the defined types were placed in this encounter.   This patient was seen by Drema Dallas, PA-C in collaboration with Dr. Clayborn Bigness as a part of collaborative care agreement.  Total time spent:35 Minutes  Time spent includes review of chart, medications, test results, and follow up plan with the patient.     Lavera Guise, MD  Internal Medicine

## 2022-12-25 LAB — UA/M W/RFLX CULTURE, ROUTINE
Bilirubin, UA: NEGATIVE
Glucose, UA: NEGATIVE
Ketones, UA: NEGATIVE
Leukocytes,UA: NEGATIVE
Nitrite, UA: NEGATIVE
Protein,UA: NEGATIVE
RBC, UA: NEGATIVE
Specific Gravity, UA: 1.026 (ref 1.005–1.030)
Urobilinogen, Ur: 0.2 mg/dL (ref 0.2–1.0)
pH, UA: 5.5 (ref 5.0–7.5)

## 2022-12-25 LAB — MICROSCOPIC EXAMINATION: Casts: NONE SEEN /lpf

## 2023-01-15 ENCOUNTER — Ambulatory Visit (INDEPENDENT_AMBULATORY_CARE_PROVIDER_SITE_OTHER): Payer: BC Managed Care – PPO | Admitting: Advanced Practice Midwife

## 2023-01-15 ENCOUNTER — Encounter: Payer: Self-pay | Admitting: Advanced Practice Midwife

## 2023-01-15 VITALS — BP 120/80 | Ht 67.0 in | Wt 204.0 lb

## 2023-01-15 DIAGNOSIS — Z3041 Encounter for surveillance of contraceptive pills: Secondary | ICD-10-CM | POA: Diagnosis not present

## 2023-01-15 MED ORDER — NORETHINDRONE 0.35 MG PO TABS: 1.0000 | ORAL_TABLET | Freq: Every day | ORAL | 4 refills | Status: DC

## 2023-01-15 NOTE — Progress Notes (Signed)
Patient ID: Rebecca Woods, female   DOB: 24-May-1996, 27 y.o.   MRN: [id number]  Reason for visit: Restart birth control   Subjective:  HPI:  Rebecca Woods is a 27 y.o. female being seen to re-establish birth control use. She was prescribed Micronor POP 2 years ago. She did not take it for some period of time and then restarted before running out of pills. Now she is ready to restart. She denies any change in health status since her last visit. Her PAP smear is up to date and next due in 2025. She has no other concerns today.  Past Medical History:  Diagnosis Date   Acute vaginitis 09/26/2019   Anemia    H/O   Bacterial vaginitis 08/02/2019   Dyspareunia due to medical condition in female 05/24/2016   GERD (gastroesophageal reflux disease)    NO MEDS   Family History  Problem Relation Age of Onset   Diabetes Mother    Cancer Paternal Grandfather        STOMACH OR COLON NOT SURE   Thyroid disease Neg Hx    Past Surgical History:  Procedure Laterality Date   LAPAROSCOPY N/A 05/24/2016   Procedure: LAPAROSCOPY DIAGNOSTIC;  Surgeon: Gae Dry, MD;  Location: [facility] ORS;  Service: Gynecology;  Laterality: N/A;   OVARIAN CYST SURGERY  2017    Short Social History:  Social History   Tobacco Use   Smoking status: Never   Smokeless tobacco: Never  Substance Use Topics   Alcohol use: Not Currently    No Known Allergies  Current Outpatient Medications  Medication Sig Dispense Refill   norethindrone (MICRONOR) 0.35 MG tablet Take 1 tablet (0.35 mg total) by mouth daily. 84 tablet 4   No current facility-administered medications for this visit.   Review of Systems  Constitutional:  Negative for chills and fever.  HENT:  Negative for congestion, ear discharge, ear pain, hearing loss, sinus pain and sore throat.   Eyes:  Negative for blurred vision and double vision.  Respiratory:  Negative for cough, shortness of breath and wheezing.   Cardiovascular:  Negative for chest  pain, palpitations and leg swelling.  Gastrointestinal:  Negative for abdominal pain, blood in stool, constipation, diarrhea, heartburn, melena, nausea and vomiting.  Genitourinary:  Negative for dysuria, flank pain, frequency, hematuria and urgency.  Musculoskeletal:  Negative for back pain, joint pain and myalgias.  Skin:  Negative for itching and rash.  Neurological:  Negative for dizziness, tingling, tremors, sensory change, speech change, focal weakness, seizures, loss of consciousness, weakness and headaches.  Endo/Heme/Allergies:  Negative for environmental allergies. Does not bruise/bleed easily.  Psychiatric/Behavioral:  Negative for depression, hallucinations, memory loss, substance abuse and suicidal ideas. The patient is not nervous/anxious and does not have insomnia.       Objective:  Objective   Vitals:   01/15/23 1417  BP: 120/80  Weight: 204 lb (92.5 kg)  Height: 5\' 7"  (1.702 m)   Body mass index is 31.95 kg/m. Constitutional: Well nourished, well developed female in no acute distress.  HEENT: normal Skin: Warm and dry.  Cardiovascular: Regular rate and rhythm.   Respiratory: Clear to auscultation bilateral. Normal respiratory effort Psych: Alert and Oriented x3. No memory deficits. Normal mood and affect.    Assessment/Plan:     27 y.o. G2 P61 female re-starting birth control pill  Rx Micronor Return to office as needed and for annual in 1 year   [facility] 01/15/2023, 5:04 PM

## 2023-04-05 ENCOUNTER — Ambulatory Visit: Payer: BC Managed Care – PPO | Admitting: Physician Assistant

## 2023-04-05 ENCOUNTER — Ambulatory Visit: Admission: EM | Admit: 2023-04-05 | Payer: BC Managed Care – PPO | Source: Home / Self Care

## 2023-04-05 ENCOUNTER — Ambulatory Visit
Admission: EM | Admit: 2023-04-05 | Discharge: 2023-04-05 | Disposition: A | Discharge status: Home / Self Care | Payer: BC Managed Care – PPO | Attending: Emergency Medicine | Admitting: Emergency Medicine

## 2023-04-05 VITALS — BP 110/74 | HR 79 | Temp 97.8°F | Resp 16

## 2023-04-05 DIAGNOSIS — N898 Other specified noninflammatory disorders of vagina: Secondary | ICD-10-CM

## 2023-04-05 DIAGNOSIS — R109 Unspecified abdominal pain: Secondary | ICD-10-CM | POA: Diagnosis not present

## 2023-04-05 DIAGNOSIS — Z113 Encounter for screening for infections with a predominantly sexual mode of transmission: Secondary | ICD-10-CM | POA: Diagnosis not present

## 2023-04-05 LAB — POCT URINALYSIS DIP (MANUAL ENTRY)
Bilirubin, UA: NEGATIVE
Glucose, UA: NEGATIVE mg/dL
Ketones, POC UA: NEGATIVE mg/dL
Leukocytes, UA: NEGATIVE
Nitrite, UA: NEGATIVE
Protein Ur, POC: NEGATIVE mg/dL
Spec Grav, UA: >=1.03 — AB (ref 1.010–1.025)
Urobilinogen, UA: 0.2 E.U./dL
pH, UA: 6.5 (ref 5.0–8.0)

## 2023-04-05 MED ORDER — METRONIDAZOLE 500 MG PO TABS: 500.0000 mg | ORAL_TABLET | Freq: Two times a day (BID) | ORAL | 0 refills | Status: DC

## 2023-04-05 NOTE — ED Provider Notes (Signed)
Renaldo Fiddler    CSN: [id number] Arrival date & time: 04/05/23  1627      History   Chief Complaint Chief Complaint  Patient presents with   Vaginal Discharge   Flank Pain    HPI Rebecca Woods is a 27 y.o. female.   Presents for  evaluation of Leeum Sankey to  clear vaginal discharge, urinary frequency, urinary urgency, lower abdominal pressure occurring intermittently, lower back pain and a urinary odor present for 3 days.  Has not attempted treatment of symptoms.  Sexually active, no concern for STD.  Denies vaginal itching, vaginal odor, dysuria, hematuria.  Past Medical History:  Diagnosis Date   Acute vaginitis 09/26/2019   Anemia    H/O   Bacterial vaginitis 08/02/2019   Dyspareunia due to medical condition in female 05/24/2016   GERD (gastroesophageal reflux disease)    NO MEDS    Patient Active Problem List   Diagnosis Date Noted   Umbilical hernia 03/23/2020    Past Surgical History:  Procedure Laterality Date   LAPAROSCOPY N/A 05/24/2016   Procedure: LAPAROSCOPY DIAGNOSTIC;  Surgeon: Nadara Mustard, MD;  Location: [facility] ORS;  Service: Gynecology;  Laterality: N/A;   OVARIAN CYST SURGERY  2017    OB History     Gravida  2   Para  2   Term  1   Preterm  1   AB  0   Living  2      SAB  0   IAB  0   Ectopic  0   Multiple  0   Live Births  2            Home Medications    Prior to Admission medications   Medication Sig Start Date End Date Taking? Authorizing Provider  metroNIDAZOLE (FLAGYL) 500 MG tablet Take 1 tablet (500 mg total) by mouth 2 (two) times daily. 04/05/23  Yes Bettye Sitton R, NP  norethindrone (MICRONOR) 0.35 MG tablet Take 1 tablet (0.35 mg total) by mouth daily. 01/15/23   Tresea Mall, CNM    Family History Family History  Problem Relation Age of Onset   Diabetes Mother    Cancer Paternal Grandfather        STOMACH OR COLON NOT SURE   Thyroid disease Neg Hx     Social History Social History    Tobacco Use   Smoking status: Never   Smokeless tobacco: Never  Vaping Use   Vaping Use: Never used  Substance Use Topics   Alcohol use: Not Currently   Drug use: Not Currently    Types: Marijuana     Allergies   Patient has no known allergies.   Review of Systems Review of Systems  Genitourinary:  Positive for flank pain and vaginal discharge.     Physical Exam Triage Vital Signs ED Triage Vitals  Enc Vitals Group     BP 04/05/23 1722 110/74     Pulse Rate 04/05/23 1722 79     Resp 04/05/23 1722 16     Temp 04/05/23 1722 97.8 F (36.6 C)     Temp Source 04/05/23 1722 Temporal     SpO2 04/05/23 1722 97 %     Weight --      Height --      Head Circumference --      Peak Flow --      Pain Score 04/05/23 1723 0     Pain Loc --      Pain Edu? --  Excl. in GC? --    No data found.  Updated Vital Signs BP 110/74 (BP Location: Left Arm)   Pulse 79   Temp 97.8 F (36.6 C) (Temporal)   Resp 16   SpO2 97%   Breastfeeding No   Visual Acuity Right Eye Distance:   Left Eye Distance:   Bilateral Distance:    Right Eye Near:   Left Eye Near:    Bilateral Near:     Physical Exam Constitutional:      Appearance: Normal appearance.  Eyes:     Extraocular Movements: Extraocular movements intact.  Pulmonary:     Effort: Pulmonary effort is normal.  Genitourinary:    Comments: deferred Neurological:     Mental Status: She is alert and oriented to person, place, and time. Mental status is at baseline.      UC Treatments / Results  Labs (all labs ordered are listed, but only abnormal results are displayed) Labs Reviewed  POCT URINALYSIS DIP (MANUAL ENTRY) - Abnormal; Notable for the following components:      Result Value   Clarity, UA cloudy (*)    Spec Grav, UA >=1.030 (*)    Blood, UA trace-intact (*)    All other components within normal limits  CERVICOVAGINAL ANCILLARY ONLY    EKG   Radiology No results  found.  Procedures Procedures (including critical care time)  Medications Ordered in UC Medications - No data to display  Initial Impression / Assessment and Plan / UC Course  I have reviewed the triage vital signs and the nursing notes.  Pertinent labs & imaging results that were available during my care of the patient were reviewed by me and considered in my medical decision making (see chart for details).  Vaginal discharge  Urinalysis negative, treating prophylactically for BV, MetroGel sent to pharmacy, advised against alcohol during use, STI panel pending, will treat further per protocol, advised abstinence until lab results treatment is complete and all symptoms have resolved, may follow-up with urgent care as needed Final Clinical Impressions(s) / UC Diagnoses   Final diagnoses:  Vaginal discharge     Discharge Instructions      Today you are being treated prophylactically for  Bacterial vaginosis   Urinalysis shows no signs of infection  Take Metronidazole 500 mg twice a day for 7 days, do not drink alcohol while using medication, this will make you feel sick   Bacterial vaginosis which results from an overgrowth of one on several organisms that are normally present in your vagina. Vaginosis is an inflammation of the vagina that can result in discharge, itching and pain.  Labs pending , you will be contacted if positive for any sti and treatment will be sent to the pharmacy, you will have to return to the clinic if positive for gonorrhea to receive treatment   Please refrain from having sex until labs results, if positive please refrain from having sex until treatment complete and symptoms resolve   If positive for  Chlamydia  gonorrhea or trichomoniasis please notify partner or partners so they may tested as well  Moving forward, it is recommended you use some form of protection against the transmission of sti infections  such as condoms or dental dams with each  sexual encounter     In addition: Avoid baths, hot tubs and whirlpool spas.  Don't use scented or harsh soaps Avoid irritants. These include scented tampons and pads. Wipe from front to back after using the toilet. Don't douche.  Your vagina doesn't require cleansing other than normal bathing.  Use a condom.  Wear cotton underwear, this fabric absorbs some moisture.        ED Prescriptions     Medication Sig Dispense Auth. Provider   metroNIDAZOLE (FLAGYL) 500 MG tablet Take 1 tablet (500 mg total) by mouth 2 (two) times daily. 14 tablet Tarry [place], Elita Boone, NP      PDMP not reviewed this encounter.   Valinda Hoar, [place] 04/05/23 1746

## 2023-04-05 NOTE — Discharge Instructions (Signed)
Today you are being treated prophylactically for  Bacterial vaginosis   Urinalysis shows no signs of infection  Take Metronidazole 500 mg twice a day for 7 days, do not drink alcohol while using medication, this will make you feel sick   Bacterial vaginosis which results from an overgrowth of one on several organisms that are normally present in your vagina. Vaginosis is an inflammation of the vagina that can result in discharge, itching and pain.  Labs pending , you will be contacted if positive for any sti and treatment will be sent to the pharmacy, you will have to return to the clinic if positive for gonorrhea to receive treatment   Please refrain from having sex until labs results, if positive please refrain from having sex until treatment complete and symptoms resolve   If positive for  Chlamydia  gonorrhea or trichomoniasis please notify partner or partners so they may tested as well  Moving forward, it is recommended you use some form of protection against the transmission of sti infections  such as condoms or dental dams with each sexual encounter     In addition: Avoid baths, hot tubs and whirlpool spas.  Don't use scented or harsh soaps Avoid irritants. These include scented tampons and pads. Wipe from front to back after using the toilet. Don't douche. Your vagina doesn't require cleansing other than normal bathing.  Use a condom.  Wear cotton underwear, this fabric absorbs some moisture.

## 2023-04-05 NOTE — ED Triage Notes (Signed)
Patient presents to UC for back pain, vaginal discharge and odor to urine since 3 days.

## 2023-04-08 LAB — CERVICOVAGINAL ANCILLARY ONLY
Bacterial Vaginitis (gardnerella): NEGATIVE
Candida Glabrata: NEGATIVE
Candida Vaginitis: NEGATIVE
Chlamydia: NEGATIVE
Comment: NEGATIVE
Comment: NEGATIVE
Comment: NEGATIVE
Comment: NEGATIVE
Comment: NEGATIVE
Comment: NORMAL
Neisseria Gonorrhea: NEGATIVE
Trichomonas: NEGATIVE

## 2023-06-11 ENCOUNTER — Ambulatory Visit (LOCAL_COMMUNITY_HEALTH_CENTER): Payer: BC Managed Care – PPO

## 2023-06-11 VITALS — BP 120/72 | Ht 67.0 in | Wt 167.0 lb

## 2023-06-11 DIAGNOSIS — Z308 Encounter for other contraceptive management: Secondary | ICD-10-CM | POA: Diagnosis not present

## 2023-06-11 DIAGNOSIS — Z30012 Encounter for prescription of emergency contraception: Secondary | ICD-10-CM

## 2023-06-11 MED ORDER — ELLA 30 MG PO TABS: 1.0000 | ORAL_TABLET | Freq: Once | ORAL | 0 refills | Status: AC

## 2023-06-11 NOTE — Progress Notes (Signed)
In nurse clinic for ECP. LMP 05/13/2023. Last unprotected sex 06/10/2023. BMI 26.2, so patient will receive 1 Ella pill. ECP consent signed. EC info sheet given to patient. 1 Ella pill sent to patient's pharmacy, as patient has insurance, per verbal order by Aliene Altes, FNP. All questions answered and verbalizes understanding.   Abagail Kitchens, RN

## 2023-09-19 ENCOUNTER — Telehealth (INDEPENDENT_AMBULATORY_CARE_PROVIDER_SITE_OTHER): Payer: BC Managed Care – PPO | Admitting: Nurse Practitioner

## 2023-09-19 ENCOUNTER — Encounter: Payer: Self-pay | Admitting: Nurse Practitioner

## 2023-09-19 VITALS — Resp 16 | Ht 67.0 in | Wt 188.0 lb

## 2023-09-19 DIAGNOSIS — J209 Acute bronchitis, unspecified: Secondary | ICD-10-CM | POA: Diagnosis not present

## 2023-09-19 MED ORDER — BENZONATATE 200 MG PO CAPS: 200.0000 mg | ORAL_CAPSULE | Freq: Three times a day (TID) | ORAL | 0 refills | Status: DC | PRN

## 2023-09-19 MED ORDER — AZITHROMYCIN 250 MG PO TABS: ORAL_TABLET | ORAL | 0 refills | Status: AC

## 2023-09-19 NOTE — Progress Notes (Signed)
[facility] [address] [place], [place] [address]  Internal MEDICINE  Telephone Visit  Patient Name: Rebecca Woods  [id number]  [id number]  Date of Service: 09/19/2023  I connected with the patient at 0850 by telephone and verified the patients identity using two identifiers.   I discussed the limitations, risks, security and privacy concerns of performing an evaluation and management service by telephone and the availability of in person appointments. I also discussed with the patient that there may be a patient responsible charge related to the service.  The patient expressed understanding and agrees to proceed.    Chief Complaint  Patient presents with   Telephone Screen    Bad cough since last Saturday, fever, chills, upper back ache. No covid test   Telephone Assessment    HPI Rebecca Woods presents for a telehealth virtual visit for cough and other symptoms.  Symptoms started about 5 days ago.  Reports nasal congestion, headache, fatigue, cough, chills, fever, decreased appetite, hoarseness.    Current Medication: Outpatient Encounter Medications as of 09/19/2023  Medication Sig   azithromycin (ZITHROMAX) 250 MG tablet Take 2 tablets on day 1, then 1 tablet daily on days 2 through 5   benzonatate (TESSALON) 200 MG capsule Take 1 capsule (200 mg total) by mouth 3 (three) times daily as needed for cough.   metroNIDAZOLE (FLAGYL) 500 MG tablet Take 1 tablet (500 mg total) by mouth 2 (two) times daily.   norethindrone (MICRONOR) 0.35 MG tablet Take 1 tablet (0.35 mg total) by mouth daily.   No facility-administered encounter medications on file as of 09/19/2023.    Surgical History: Past Surgical History:  Procedure Laterality Date   LAPAROSCOPY N/A 05/24/2016   Procedure: LAPAROSCOPY DIAGNOSTIC;  Surgeon: Nadara Mustard, MD;  Location: [facility] ORS;  Service: Gynecology;  Laterality: N/A;   OVARIAN CYST SURGERY  2017    Medical History: Past Medical History:   Diagnosis Date   Acute vaginitis 09/26/2019   Anemia    H/O   Bacterial vaginitis 08/02/2019   Dyspareunia due to medical condition in female 05/24/2016   GERD (gastroesophageal reflux disease)    NO MEDS    Family History: Family History  Problem Relation Age of Onset   Diabetes Mother    Cancer Paternal Grandfather        STOMACH OR COLON NOT SURE   Thyroid disease Neg Hx     Social History   Socioeconomic History   Marital status: Single    Spouse name: Not on file   Number of children: 1   Years of education: Not on file   Highest education level: High school graduate  Occupational History   Occupation: ADMIN    Comment: [employer]  Tobacco Use   Smoking status: Never   Smokeless tobacco: Never  Vaping Use   Vaping status: Never Used  Substance and Sexual Activity   Alcohol use: Not Currently   Drug use: Not Currently    Types: Marijuana   Sexual activity: Yes    Birth control/protection: Pill    Comment: mirena-planning  Other Topics Concern   Not on file  Social History Narrative   Not on file   Social Determinants of Health   Financial Resource Strain: Not on file  Food Insecurity: Not on file  Transportation Needs: Not on file  Physical Activity: Not on file  Stress: Not on file  Social Connections: Not on file  Intimate Partner Violence: Not on file  Review of Systems  Constitutional:  Positive for appetite change, chills, fatigue and fever.  HENT:  Positive for congestion, postnasal drip, rhinorrhea and voice change. Negative for sinus pressure, sinus pain, sneezing and sore throat.   Respiratory:  Positive for cough. Negative for chest tightness, shortness of breath and wheezing.   Cardiovascular: Negative.  Negative for chest pain and palpitations.  Gastrointestinal:  Negative for diarrhea, nausea and vomiting.  Neurological:  Positive for headaches.    Vital Signs: Resp 16   Ht 5\' 7"  (1.702 m)   Wt 188 lb (85.3 kg)   BMI 29.44 kg/m     Observation/Objective: She is alert and oriented.  No acute distress noted. Hoarseness noted via audio.     Assessment/Plan: 1. Acute bronchitis, unspecified organism Zpak and cough medication prescribed.  - azithromycin (ZITHROMAX) 250 MG tablet; Take 2 tablets on day 1, then 1 tablet daily on days 2 through 5  Dispense: 6 tablet; Refill: 0 - benzonatate (TESSALON) 200 MG capsule; Take 1 capsule (200 mg total) by mouth 3 (three) times daily as needed for cough.  Dispense: 30 capsule; Refill: 0   General Counseling: Rebecca Woods verbalizes understanding of the findings of today's phone visit and agrees with plan of treatment. I have discussed any further diagnostic evaluation that may be needed or ordered today. We also reviewed her medications today. she has been encouraged to call the office with any questions or concerns that should arise related to todays visit.  No follow-ups on file.   No orders of the defined types were placed in this encounter.   Meds ordered this encounter  Medications   azithromycin (ZITHROMAX) 250 MG tablet    Sig: Take 2 tablets on day 1, then 1 tablet daily on days 2 through 5    Dispense:  6 tablet    Refill:  0   benzonatate (TESSALON) 200 MG capsule    Sig: Take 1 capsule (200 mg total) by mouth 3 (three) times daily as needed for cough.    Dispense:  30 capsule    Refill:  0    Time spent:10 Minutes Time spent with patient included reviewing progress notes, labs, imaging studies, and discussing plan for follow up.  [place] Controlled Substance Database was reviewed by me for overdose risk score (ORS) if appropriate.  This patient was seen by Sallyanne Kuster, FNP-C in collaboration with Dr. Beverely Risen as a part of collaborative care agreement.  Baylea Milburn R. Tedd Sias, MSN, FNP-C Internal medicine

## 2023-10-29 DIAGNOSIS — Z3009 Encounter for other general counseling and advice on contraception: Secondary | ICD-10-CM | POA: Insufficient documentation

## 2023-10-30 HISTORY — PX: DILATION AND CURETTAGE OF UTERUS: SHX78

## 2023-12-16 DIAGNOSIS — O034 Incomplete spontaneous abortion without complication: Secondary | ICD-10-CM | POA: Insufficient documentation

## 2023-12-20 ENCOUNTER — Telehealth: Payer: Self-pay | Admitting: Physician Assistant

## 2023-12-20 NOTE — Telephone Encounter (Signed)
Left vm and sent mychart message to confirm 12/27/23 appointment-Toni

## 2023-12-27 ENCOUNTER — Other Ambulatory Visit: Payer: Medicaid Other | Admitting: Physician Assistant

## 2024-01-10 ENCOUNTER — Encounter: Payer: Self-pay | Admitting: Physician Assistant

## 2024-01-10 ENCOUNTER — Ambulatory Visit (INDEPENDENT_AMBULATORY_CARE_PROVIDER_SITE_OTHER): Payer: Medicaid Other | Admitting: Physician Assistant

## 2024-01-10 VITALS — BP 120/80 | HR 88 | Temp 98.0°F | Resp 16 | Ht 67.0 in | Wt 192.0 lb

## 2024-01-10 DIAGNOSIS — Z0001 Encounter for general adult medical examination with abnormal findings: Secondary | ICD-10-CM | POA: Diagnosis not present

## 2024-01-10 NOTE — Progress Notes (Signed)
 [facility] [address] [place], [place] [address]  Internal MEDICINE  Office Visit Note  Patient Name: Rebecca Woods  [id number]  [id number]  Date of Service: 01/10/2024  Chief Complaint  Patient presents with   Annual Exam     HPI Pt is here for routine health maintenance examination -Pt did have an abortion in Dec, On Feb 18 they did a D&C due to retained products -has not had cycle yet, does feel a little cramping as if it may start soon -due for pap and is going to follow up with GYN about this when discussing possibly restarting BC since procedure -Not taking any medications currently -Sleeping well, eating well -exercising 4-5 days per week -does have a few small skin tags she is watching  Current Medication: Outpatient Encounter Medications as of 01/10/2024  Medication Sig   [DISCONTINUED] benzonatate (TESSALON) 200 MG capsule Take 1 capsule (200 mg total) by mouth 3 (three) times daily as needed for cough.   [DISCONTINUED] metroNIDAZOLE (FLAGYL) 500 MG tablet Take 1 tablet (500 mg total) by mouth 2 (two) times daily.   [DISCONTINUED] norethindrone (MICRONOR) 0.35 MG tablet Take 1 tablet (0.35 mg total) by mouth daily. (Patient not taking: Reported on 01/10/2024)   No facility-administered encounter medications on file as of 01/10/2024.    Surgical History: Past Surgical History:  Procedure Laterality Date   LAPAROSCOPY N/A 05/24/2016   Procedure: LAPAROSCOPY DIAGNOSTIC;  Surgeon: Nadara Mustard, MD;  Location: [facility] ORS;  Service: Gynecology;  Laterality: N/A;   OVARIAN CYST SURGERY  2017    Medical History: Past Medical History:  Diagnosis Date   Acute vaginitis 09/26/2019   Anemia    H/O   Bacterial vaginitis 08/02/2019   Dyspareunia due to medical condition in female 05/24/2016   GERD (gastroesophageal reflux disease)    NO MEDS    Family History: Family History  Problem Relation Age of Onset   Diabetes Mother    Cancer Paternal  Grandfather        STOMACH OR COLON NOT SURE   Thyroid disease Neg Hx       Review of Systems  Constitutional:  Negative for chills, fatigue and unexpected weight change.  HENT:  Negative for congestion, postnasal drip, rhinorrhea, sneezing and sore throat.   Eyes:  Negative for redness.  Respiratory:  Negative for cough, chest tightness and shortness of breath.   Cardiovascular:  Negative for chest pain and palpitations.  Gastrointestinal:  Negative for abdominal pain, constipation, diarrhea, nausea and vomiting.  Genitourinary:  Negative for dysuria and frequency.  Musculoskeletal:  Negative for arthralgias, back pain, joint swelling and neck pain.  Skin:  Negative for rash.  Neurological: Negative.  Negative for tremors and numbness.  Hematological:  Negative for adenopathy. Does not bruise/bleed easily.  Psychiatric/Behavioral:  Negative for behavioral problems (Depression), sleep disturbance and suicidal ideas. The patient is not nervous/anxious.      Vital Signs: BP 120/80   Pulse 88   Temp 98 F (36.7 C)   Resp 16   Ht 5\' 7"  (1.702 m)   Wt 192 lb (87.1 kg)   SpO2 99%   BMI 30.07 kg/m    Physical Exam Vitals and nursing note reviewed.  Constitutional:      General: She is not in acute distress.    Appearance: She is well-developed. She is obese. She is not diaphoretic.  HENT:     Head: Normocephalic and atraumatic.     Right Ear: External ear  normal.     Left Ear: External ear normal.     Nose: Nose normal.     Mouth/Throat:     Pharynx: No oropharyngeal exudate.  Eyes:     Pupils: Pupils are equal, round, and reactive to light.  Neck:     Thyroid: No thyromegaly.     Vascular: No JVD.     Trachea: No tracheal deviation.  Cardiovascular:     Rate and Rhythm: Normal rate and regular rhythm.     Heart sounds: Normal heart sounds. No murmur heard.    No friction rub. No gallop.  Pulmonary:     Effort: Pulmonary effort is normal. No respiratory distress.      Breath sounds: Normal breath sounds. No stridor. No wheezing or rales.  Chest:     Chest wall: No tenderness.  Breasts:    Right: Normal. No mass.     Left: Normal. No mass.  Abdominal:     General: Bowel sounds are normal.     Palpations: Abdomen is soft.     Tenderness: There is no abdominal tenderness.  Musculoskeletal:        General: No tenderness or deformity. Normal range of motion.     Cervical back: Normal range of motion and neck supple.  Lymphadenopathy:     Cervical: No cervical adenopathy.  Skin:    General: Skin is warm and dry.     Coloration: Skin is not pale.     Findings: No erythema or rash.     Comments: Two small skin tags on neck  Neurological:     Mental Status: She is alert and oriented to person, place, and time.     Motor: No abnormal muscle tone.     Deep Tendon Reflexes: Reflexes are normal and symmetric.  Psychiatric:        Behavior: Behavior normal.        Thought Content: Thought content normal.        Judgment: Judgment normal.      LABS: No results found for this or any previous visit (from the past 2160 hours).      Assessment/Plan: 1. Encounter for general adult medical examination with abnormal findings (Primary) CPE performed, lab slip given, due for pap and will follow up with GYN for this   General Counseling: Harshita verbalizes understanding of the findings of todays visit and agrees with plan of treatment. I have discussed any further diagnostic evaluation that may be needed or ordered today. We also reviewed her medications today. she has been encouraged to call the office with any questions or concerns that should arise related to todays visit.    Counseling:    No orders of the defined types were placed in this encounter.   No orders of the defined types were placed in this encounter.   This patient was seen by Lynn Ito, PA-C in collaboration with Dr. Beverely Risen as a part of collaborative care  agreement.  Total time spent:35 Minutes  Time spent includes review of chart, medications, test results, and follow up plan with the patient.     Lyndon Code, MD  Internal Medicine

## 2024-01-13 ENCOUNTER — Encounter: Payer: Self-pay | Admitting: Physician Assistant

## 2024-01-13 ENCOUNTER — Ambulatory Visit: Admitting: Physician Assistant

## 2024-01-13 VITALS — BP 115/75 | HR 93 | Temp 97.6°F | Resp 16 | Ht 67.0 in | Wt 193.0 lb

## 2024-01-13 DIAGNOSIS — L03031 Cellulitis of right toe: Secondary | ICD-10-CM | POA: Diagnosis not present

## 2024-01-13 DIAGNOSIS — M79674 Pain in right toe(s): Secondary | ICD-10-CM | POA: Diagnosis not present

## 2024-01-13 MED ORDER — SULFAMETHOXAZOLE-TRIMETHOPRIM 800-160 MG PO TABS: 1.0000 | ORAL_TABLET | Freq: Two times a day (BID) | ORAL | 0 refills | Status: DC

## 2024-01-13 NOTE — Progress Notes (Signed)
 [facility] [address] [place], [place] [address]  Internal MEDICINE  Office Visit Note  Patient Name: Rebecca Woods  [id number]  [id number]  Date of Service: 01/13/2024  Chief Complaint  Patient presents with   Acute Visit   Toe Pain    Possible toe injury or ingrown toenail     HPI Pt is here for a sick visit. -Right big toe started with a bruise around end of Feb, still got pedicure be didn't look infected or anything -recently took polish off and now sees discoloration under the nail -Now swollen and tender around nail and and get some sharp pains  Current Medication:  Outpatient Encounter Medications as of 01/13/2024  Medication Sig   sulfamethoxazole-trimethoprim (BACTRIM DS) 800-160 MG tablet Take 1 tablet by mouth 2 (two) times daily.   No facility-administered encounter medications on file as of 01/13/2024.      Medical History: Past Medical History:  Diagnosis Date   Acute vaginitis 09/26/2019   Anemia    H/O   Bacterial vaginitis 08/02/2019   Dyspareunia due to medical condition in female 05/24/2016   GERD (gastroesophageal reflux disease)    NO MEDS     Vital Signs: BP 115/75   Pulse 93   Temp 97.6 F (36.4 C)   Resp 16   Ht 5\' 7"  (1.702 m)   Wt 193 lb (87.5 kg)   SpO2 99%   BMI 30.23 kg/m    Review of Systems  Constitutional:  Negative for fatigue and fever.  HENT:  Negative for congestion, mouth sores and postnasal drip.   Respiratory:  Negative for cough.   Cardiovascular:  Negative for chest pain.  Genitourinary:  Negative for flank pain.  Musculoskeletal:  Positive for joint swelling.  Skin:  Positive for color change.       Right big toe tender with nail change  Psychiatric/Behavioral: Negative.      Physical Exam Vitals and nursing note reviewed.  Constitutional:      Appearance: Normal appearance.  HENT:     Head: Normocephalic and atraumatic.  Cardiovascular:     Rate and Rhythm: Normal rate and regular  rhythm.  Pulmonary:     Effort: Pulmonary effort is normal.     Breath sounds: Normal breath sounds.  Musculoskeletal:        General: Swelling and tenderness present.  Skin:    Findings: Erythema present.     Comments: Erythema, swelling and tenderness at proximal edge of right great toe. Discoloration or nail consistent with pocket of infection.   Neurological:     Mental Status: She is alert.  Psychiatric:        Mood and Affect: Mood normal.        Behavior: Behavior normal.       Assessment/Plan: 1. Paronychia of great toe of right foot Will start on Bactrim and advised to try epsom salt soaks. Will also place urgent podiatry referral as this will likely require drainage. Advised to call if new or worsening symptoms - Ambulatory referral to Podiatry - sulfamethoxazole-trimethoprim (BACTRIM DS) 800-160 MG tablet; Take 1 tablet by mouth 2 (two) times daily.  Dispense: 20 tablet; Refill: 0  2. Pain of right great toe (Primary) - Ambulatory referral to Podiatry   General Counseling: Aviyanna verbalizes understanding of the findings of todays visit and agrees with plan of treatment. I have discussed any further diagnostic evaluation that may be needed or ordered today. We also reviewed her medications today. she  has been encouraged to call the office with any questions or concerns that should arise related to todays visit.    Counseling:    Orders Placed This Encounter  Procedures   Ambulatory referral to Podiatry    Meds ordered this encounter  Medications   sulfamethoxazole-trimethoprim (BACTRIM DS) 800-160 MG tablet    Sig: Take 1 tablet by mouth 2 (two) times daily.    Dispense:  20 tablet    Refill:  0    Time spent:30 Minutes

## 2024-01-29 ENCOUNTER — Encounter: Payer: Self-pay | Admitting: Podiatry

## 2024-01-29 ENCOUNTER — Ambulatory Visit: Admitting: Podiatry

## 2024-01-29 DIAGNOSIS — L03031 Cellulitis of right toe: Secondary | ICD-10-CM | POA: Diagnosis not present

## 2024-01-29 DIAGNOSIS — L603 Nail dystrophy: Secondary | ICD-10-CM

## 2024-01-29 MED ORDER — MUPIROCIN 2 % EX OINT: 1.0000 | TOPICAL_OINTMENT | Freq: Two times a day (BID) | CUTANEOUS | 0 refills | Status: AC

## 2024-01-29 NOTE — Progress Notes (Signed)
  Subjective:  Patient ID: Rebecca Woods, female    DOB: [birth year],  MRN: [id number] HPI Chief Complaint  Patient presents with   Nail Problem    Hallux right - nail discolored, had infection last week where the base of the nail was red-put on antibiotics-helped, but nail still looks bad, not sore, no injury   New Patient (Initial Visit)    28 y.o. female presents with the above complaint.   ROS: Denies fever chills nausea vomit muscle aches pains calf pain back pain chest pain shortness of breath.  Has been using Bactrim from her primary care provider and soaking her toe.  Has since improved considerably and is nearly resolved.  Past Medical History:  Diagnosis Date   Acute vaginitis 09/26/2019   Anemia    H/O   Bacterial vaginitis 08/02/2019   Dyspareunia due to medical condition in female 05/24/2016   GERD (gastroesophageal reflux disease)    NO MEDS   Past Surgical History:  Procedure Laterality Date   LAPAROSCOPY N/A 05/24/2016   Procedure: LAPAROSCOPY DIAGNOSTIC;  Surgeon: Nadara Mustard, MD;  Location: [facility] ORS;  Service: Gynecology;  Laterality: N/A;   OVARIAN CYST SURGERY  2017    Current Outpatient Medications:    misoprostol (CYTOTEC) 200 MCG tablet, Take 800 mcg by mouth once., Disp: , Rfl:    mupirocin ointment (BACTROBAN) 2 %, Apply 1 Application topically 2 (two) times daily., Disp: 22 g, Rfl: 0  No Known Allergies Review of Systems Objective:  There were no vitals filed for this visit.  General: Well developed, nourished, in no acute distress, alert and oriented x3   Dermatological: Skin is warm, dry and supple bilateral. Nails x 10 are well maintained; remaining integument appears unremarkable at this time. There are no open sores, no preulcerative lesions, no rash or signs of infection present.  Hallux nail right demonstrates a subungual infection loss of eponychium with some postinflammatory hyperpigmentation around the proximal nail fold most likely  associated with prior infection.  I feel that she more than likely had a bacterial infection since the cuticle was retracted.  I am unable to take a sample of the nail plate since is cut short and the majority of the discoloration is subungual centrally.  Vascular: Dorsalis Pedis artery and Posterior Tibial artery pedal pulses are 2/4 bilateral with immedate capillary fill time. Pedal hair growth present. No varicosities and no lower extremity edema present bilateral.   Neruologic: Grossly intact via light touch bilateral. Vibratory intact via tuning fork bilateral. Protective threshold with Semmes Wienstein monofilament intact to all pedal sites bilateral. Patellar and Achilles deep tendon reflexes 2+ bilateral. No Babinski or clonus noted bilateral.   Musculoskeletal: No gross boney pedal deformities bilateral. No pain, crepitus, or limitation noted with foot and ankle range of motion bilateral. Muscular strength 5/5 in all groups tested bilateral.  Gait: Unassisted, Nonantalgic.    Radiographs:  None taken  Assessment & Plan:   Assessment: Resolved proximal paronychia.  Plan: Wrote a prescription for Bactroban ointment to be applied should the area turned red again.  I did encourage Epsom salt soaks if becomes symptomatic again.     Haneefah Venturini T. [place], [place]

## 2024-02-21 ENCOUNTER — Ambulatory Visit: Admitting: Podiatry

## 2024-02-21 ENCOUNTER — Encounter: Payer: Self-pay | Admitting: Podiatry

## 2024-02-21 DIAGNOSIS — B351 Tinea unguium: Secondary | ICD-10-CM

## 2024-02-21 DIAGNOSIS — R202 Paresthesia of skin: Secondary | ICD-10-CM

## 2024-02-21 NOTE — Progress Notes (Signed)
   Chief Complaint  Patient presents with   Numbness    "I was here on April 2.  Now, I'm having numbness on this toe." N - numbness toe L - 2nd right D - Sunday O - suddenly C - numbness A - none T - none    HPI: 28 y.o. female presenting today for concern of numbness to the right second toe as well as discoloration with thickening to the right hallux nail plate.  Patient states that this past weekend she went 'clubbing' and the following day noticed numbness to the right second toe.  She admits to wearing shoes that were not the most comfortable.  She continues to have some very slight numbness to the toe.  Past Medical History:  Diagnosis Date   Acute vaginitis 09/26/2019   Anemia    H/O   Bacterial vaginitis 08/02/2019   Dyspareunia due to medical condition in female 05/24/2016   GERD (gastroesophageal reflux disease)    NO MEDS    Past Surgical History:  Procedure Laterality Date   LAPAROSCOPY N/A 05/24/2016   Procedure: LAPAROSCOPY DIAGNOSTIC;  Surgeon: Alben Alma, MD;  Location: [facility] ORS;  Service: Gynecology;  Laterality: N/A;   OVARIAN CYST SURGERY  2017    No Known Allergies   Physical Exam: General: The patient is alert and oriented x3 in no acute distress.  Dermatology: Skin is warm, dry and supple bilateral lower extremities.  Right hallux nail plate is hyperkeratotic and dystrophic.  The base of the nail plate actually appears healthy and viable.  This should grow out with time  Vascular: Palpable pedal pulses bilaterally. Capillary refill within normal limits.  No appreciable edema.  No erythema.  Neurological: Grossly intact via light touch.  Light paresthesia noted to the second toe  Musculoskeletal Exam: No pedal deformities noted   Assessment/Plan of Care: 1.  Paresthesia right second toe; improved over the past week 2.  Dystrophic nail plate right hallux likely secondary to trauma  -Patient evaluated -Recommend simple observation of the  right hallux nail plate to see if it grows out with time.  The base of the nail plate actually appears healthy and viable and good demonstration of growth -Recommend appropriate fitting shoes that do not constrict the toebox area.  This is likely the reason for the numbness to the right second toe.  This should resolve over time with appropriate shoe gear -Return to clinic as needed       Dot Gazella, DPM [facility]  Dr. Dot Gazella, DPM    [address] [place], [place] [address]                Office [phone]  Fax [phone]

## 2024-03-27 ENCOUNTER — Ambulatory Visit (INDEPENDENT_AMBULATORY_CARE_PROVIDER_SITE_OTHER): Admitting: Nurse Practitioner

## 2024-03-27 ENCOUNTER — Encounter: Payer: Self-pay | Admitting: Nurse Practitioner

## 2024-03-27 VITALS — BP 108/70 | Temp 98.2°F | Resp 16 | Ht 67.0 in | Wt 192.8 lb

## 2024-03-27 DIAGNOSIS — R300 Dysuria: Secondary | ICD-10-CM | POA: Diagnosis not present

## 2024-03-27 DIAGNOSIS — R319 Hematuria, unspecified: Secondary | ICD-10-CM | POA: Diagnosis not present

## 2024-03-27 DIAGNOSIS — N390 Urinary tract infection, site not specified: Secondary | ICD-10-CM | POA: Diagnosis not present

## 2024-03-27 LAB — POCT URINALYSIS DIPSTICK
Bilirubin, UA: NEGATIVE
Glucose, UA: NEGATIVE
Leukocytes, UA: NEGATIVE
Nitrite, UA: NEGATIVE
Protein, UA: NEGATIVE
Spec Grav, UA: 1.015 (ref 1.010–1.025)
Urobilinogen, UA: 0.2 U/dL
pH, UA: 5 (ref 5.0–8.0)

## 2024-03-27 MED ORDER — NITROFURANTOIN MONOHYD MACRO 100 MG PO CAPS: 100.0000 mg | ORAL_CAPSULE | Freq: Two times a day (BID) | ORAL | 0 refills | Status: AC

## 2024-03-27 NOTE — Progress Notes (Signed)
 [facility] [address] [place], [place] [address]  Internal MEDICINE  Office Visit Note  Patient Name: Rebecca Woods  [id number]  [id number]  Date of Service: 03/27/2024  Chief Complaint  Patient presents with   Acute Visit    uti     HPI Caprice presents for an acute sick visit for symptoms of UTI --onset of symptoms was Tuesday this week --reports lower abdominal pain, back pain, urgency and frequency.  --urinalysis done in office today, positive for moderate blood. Negative for leukocytes and nitrites.     Current Medication:  Outpatient Encounter Medications as of 03/27/2024  Medication Sig   misoprostol  (CYTOTEC ) 200 MCG tablet Take 800 mcg by mouth once.   mupirocin  ointment (BACTROBAN ) 2 % Apply 1 Application topically 2 (two) times daily.   nitrofurantoin , macrocrystal-monohydrate, (MACROBID ) 100 MG capsule Take 1 capsule (100 mg total) by mouth 2 (two) times daily for 7 days. Take with food   No facility-administered encounter medications on file as of 03/27/2024.      Medical History: Past Medical History:  Diagnosis Date   Acute vaginitis 09/26/2019   Anemia    H/O   Bacterial vaginitis 08/02/2019   Dyspareunia due to medical condition in female 05/24/2016   GERD (gastroesophageal reflux disease)    NO MEDS     Vital Signs: BP 108/70   Temp 98.2 F (36.8 C)   Resp 16   Ht 5\' 7"  (1.702 m)   Wt 192 lb 12.8 oz (87.5 kg)   BMI 30.20 kg/m    Review of Systems  Constitutional: Negative.   Respiratory: Negative.  Negative for cough, chest tightness, shortness of breath and wheezing.   Cardiovascular: Negative.  Negative for chest pain and palpitations.  Genitourinary:  Positive for dysuria, flank pain, frequency, hematuria, pelvic pain and urgency.    Physical Exam Vitals reviewed.  Constitutional:      General: She is not in acute distress.    Appearance: Normal appearance. She is obese. She is not ill-appearing.  HENT:      Head: Normocephalic and atraumatic.  Eyes:     Pupils: Pupils are equal, round, and reactive to light.  Cardiovascular:     Rate and Rhythm: Normal rate and regular rhythm.  Pulmonary:     Effort: Pulmonary effort is normal. No respiratory distress.  Neurological:     Mental Status: She is alert and oriented to person, place, and time.  Psychiatric:        Mood and Affect: Mood normal.        Behavior: Behavior normal.       Assessment/Plan: 1. Urinary tract infection with hematuria, site unspecified (Primary) Urine culture sent. Nitrofurantoin  prescribed, take until gone.  - CULTURE, URINE COMPREHENSIVE - nitrofurantoin , macrocrystal-monohydrate, (MACROBID ) 100 MG capsule; Take 1 capsule (100 mg total) by mouth 2 (two) times daily for 7 days. Take with food  Dispense: 14 capsule; Refill: 0  2. Dysuria Abnormal urinalysis, urine culture sent - POCT urinalysis dipstick   General Counseling: [name] verbalizes understanding of the findings of todays visit and agrees with plan of treatment. I have discussed any further diagnostic evaluation that may be needed or ordered today. We also reviewed her medications today. she has been encouraged to call the office with any questions or concerns that should arise related to todays visit.    Counseling:    Orders Placed This Encounter  Procedures   CULTURE, URINE COMPREHENSIVE   POCT urinalysis dipstick  Meds ordered this encounter  Medications   nitrofurantoin , macrocrystal-monohydrate, (MACROBID ) 100 MG capsule    Sig: Take 1 capsule (100 mg total) by mouth 2 (two) times daily for 7 days. Take with food    Dispense:  14 capsule    Refill:  0    Fill new script today    Return for F/U, Labs with Lauren PCP .  [place] Controlled Substance Database was reviewed by me for overdose risk score (ORS)  Time spent:20 Minutes Time spent with patient included reviewing progress notes, labs, imaging studies, and discussing plan for  follow up.   This patient was seen by Laurence Pons, FNP-C in collaboration with Dr. Verneta Gone as a part of collaborative care agreement.  Betania Dizon R. Bobbi Burow, MSN, FNP-C Internal Medicine

## 2024-03-30 ENCOUNTER — Other Ambulatory Visit: Payer: Self-pay | Admitting: Nurse Practitioner

## 2024-03-31 LAB — IRON AND TIBC
Iron Saturation: 17 % (ref 15–55)
Iron: 71 ug/dL (ref 27–159)
Total Iron Binding Capacity: 430 ug/dL (ref 250–450)
UIBC: 359 ug/dL (ref 131–425)

## 2024-03-31 LAB — CULTURE, URINE COMPREHENSIVE

## 2024-03-31 LAB — HGB A1C W/O EAG: Hgb A1c MFr Bld: 5.3 % (ref 4.8–5.6)

## 2024-03-31 LAB — COMPREHENSIVE METABOLIC PANEL WITH GFR
ALT: 17 IU/L (ref 0–32)
AST: 21 IU/L (ref 0–40)
Albumin: 4 g/dL (ref 4.0–5.0)
Alkaline Phosphatase: 115 IU/L (ref 44–121)
BUN/Creatinine Ratio: 13 (ref 9–23)
BUN: 10 mg/dL (ref 6–20)
Bilirubin Total: 0.3 mg/dL (ref 0.0–1.2)
CO2: 18 mmol/L — ABNORMAL LOW (ref 20–29)
Calcium: 9.2 mg/dL (ref 8.7–10.2)
Chloride: 105 mmol/L (ref 96–106)
Creatinine, Ser: 0.77 mg/dL (ref 0.57–1.00)
Globulin, Total: 2.8 g/dL (ref 1.5–4.5)
Glucose: 84 mg/dL (ref 70–99)
Potassium: 4.5 mmol/L (ref 3.5–5.2)
Sodium: 137 mmol/L (ref 134–144)
Total Protein: 6.8 g/dL (ref 6.0–8.5)
eGFR: 108 mL/min/{1.73_m2} (ref >59)

## 2024-03-31 LAB — CBC WITH DIFFERENTIAL/PLATELET
Basophils Absolute: 0 10*3/uL (ref 0.0–0.2)
Basos: 1 %
EOS (ABSOLUTE): 0.1 10*3/uL (ref 0.0–0.4)
Eos: 2 %
Hematocrit: 38.9 % (ref 34.0–46.6)
Hemoglobin: 12.7 g/dL (ref 11.1–15.9)
Immature Grans (Abs): 0 10*3/uL (ref 0.0–0.1)
Immature Granulocytes: 0 %
Lymphocytes Absolute: 2 10*3/uL (ref 0.7–3.1)
Lymphs: 37 %
MCH: 26.2 pg — ABNORMAL LOW (ref 26.6–33.0)
MCHC: 32.6 g/dL (ref 31.5–35.7)
MCV: 80 fL (ref 79–97)
Monocytes Absolute: 0.3 10*3/uL (ref 0.1–0.9)
Monocytes: 6 %
Neutrophils Absolute: 3 10*3/uL (ref 1.4–7.0)
Neutrophils: 54 %
Platelets: 269 10*3/uL (ref 150–450)
RBC: 4.84 x10E6/uL (ref 3.77–5.28)
RDW: 14.8 % (ref 11.7–15.4)
WBC: 5.5 10*3/uL (ref 3.4–10.8)

## 2024-03-31 LAB — FERRITIN: Ferritin: 13 ng/mL — ABNORMAL LOW (ref 15–150)

## 2024-03-31 LAB — VITAMIN D 25 HYDROXY (VIT D DEFICIENCY, FRACTURES): Vit D, 25-Hydroxy: 21.9 ng/mL — ABNORMAL LOW (ref 30.0–100.0)

## 2024-03-31 LAB — B12 AND FOLATE PANEL
Folate: 11.7 ng/mL (ref >3.0)
Vitamin B-12: 487 pg/mL (ref 232–1245)

## 2024-04-21 ENCOUNTER — Ambulatory Visit (INDEPENDENT_AMBULATORY_CARE_PROVIDER_SITE_OTHER): Admitting: Nurse Practitioner

## 2024-04-21 ENCOUNTER — Encounter: Payer: Self-pay | Admitting: Nurse Practitioner

## 2024-04-21 VITALS — BP 118/80 | HR 83 | Temp 98.2°F | Resp 16 | Ht 67.0 in | Wt 190.0 lb

## 2024-04-21 DIAGNOSIS — R8290 Unspecified abnormal findings in urine: Secondary | ICD-10-CM

## 2024-04-21 DIAGNOSIS — R300 Dysuria: Secondary | ICD-10-CM | POA: Diagnosis not present

## 2024-04-21 DIAGNOSIS — Z113 Encounter for screening for infections with a predominantly sexual mode of transmission: Secondary | ICD-10-CM

## 2024-04-21 LAB — POCT URINALYSIS DIPSTICK
Bilirubin, UA: NEGATIVE
Glucose, UA: NEGATIVE
Leukocytes, UA: NEGATIVE
Nitrite, UA: NEGATIVE
Protein, UA: NEGATIVE
Spec Grav, UA: 1.015 (ref 1.010–1.025)
Urobilinogen, UA: 0.2 U/dL
pH, UA: 6 (ref 5.0–8.0)

## 2024-04-21 NOTE — Progress Notes (Signed)
 [facility] [address] [place], [place] [address]  Internal MEDICINE  Office Visit Note  Patient Name: Rebecca Woods  [id number]  [id number]  Date of Service: 04/21/2024  Chief Complaint  Patient presents with   Follow-up    HPI Rebecca Woods presents for a follow-up visit for recent UTI and STD screening Had recent unprotected sex and wants to be tested for STDs.  Also had recent UTI and wants to check to see if it has cleared.  Slight back pain and some urgency but overall symptoms have improved.    Current Medication: Outpatient Encounter Medications as of 04/21/2024  Medication Sig   misoprostol  (CYTOTEC ) 200 MCG tablet Take 800 mcg by mouth once.   mupirocin  ointment (BACTROBAN ) 2 % Apply 1 Application topically 2 (two) times daily.   No facility-administered encounter medications on file as of 04/21/2024.    Surgical History: Past Surgical History:  Procedure Laterality Date   LAPAROSCOPY N/A 05/24/2016   Procedure: LAPAROSCOPY DIAGNOSTIC;  Surgeon: Lamar SHAUNNA Lesches, MD;  Location: [facility] ORS;  Service: Gynecology;  Laterality: N/A;   OVARIAN CYST SURGERY  2017    Medical History: Past Medical History:  Diagnosis Date   Acute vaginitis 09/26/2019   Anemia    H/O   Bacterial vaginitis 08/02/2019   Dyspareunia due to medical condition in female 05/24/2016   GERD (gastroesophageal reflux disease)    NO MEDS    Family History: Family History  Problem Relation Age of Onset   Diabetes Mother    Cancer Paternal Grandfather        STOMACH OR COLON NOT SURE   Thyroid disease Neg Hx     Social History   Socioeconomic History   Marital status: Single    Spouse name: Not on file   Number of children: 1   Years of education: Not on file   Highest education level: High school graduate  Occupational History   Occupation: ADMIN    Comment: [employer]  Tobacco Use   Smoking status: Never   Smokeless tobacco: Never  Vaping Use   Vaping status: Never Used   Substance and Sexual Activity   Alcohol use: Yes    Comment: occasionally   Drug use: Not Currently    Types: Marijuana   Sexual activity: Yes    Birth control/protection: Pill    Comment: mirena-planning  Other Topics Concern   Not on file  Social History Narrative   Not on file   Social Drivers of Health   Financial Resource Strain: Not on file  Food Insecurity: Not on file  Transportation Needs: Not on file  Physical Activity: Not on file  Stress: Not on file  Social Connections: Not on file  Intimate Partner Violence: Not on file      Review of Systems  Constitutional:  Negative for chills, fatigue and unexpected weight change.  HENT:  Negative for congestion, postnasal drip, rhinorrhea, sneezing and sore throat.   Eyes:  Negative for redness.  Respiratory:  Negative for cough, chest tightness and shortness of breath.   Cardiovascular: Negative.  Negative for chest pain and palpitations.  Gastrointestinal: Negative.  Negative for abdominal pain, constipation, diarrhea, nausea and vomiting.  Genitourinary:  Positive for flank pain and urgency. Negative for dysuria and frequency.  Musculoskeletal:  Negative for arthralgias, back pain, joint swelling and neck pain.  Skin:  Negative for rash.  Neurological: Negative.  Negative for tremors and numbness.  Hematological:  Negative for adenopathy. Does not bruise/bleed easily.  Psychiatric/Behavioral:  Negative for behavioral problems (Depression), sleep disturbance and suicidal ideas. The patient is not nervous/anxious.     Vital Signs: BP 118/80   Pulse 83   Temp 98.2 F (36.8 C)   Resp 16   Ht 5' 7 (1.702 m)   Wt 190 lb (86.2 kg)   SpO2 98%   BMI 29.76 kg/m    Physical Exam Vitals reviewed.  Constitutional:      General: She is not in acute distress.    Appearance: Normal appearance. She is not ill-appearing.  HENT:     Head: Normocephalic and atraumatic.   Eyes:     Pupils: Pupils are equal, round,  and reactive to light.    Cardiovascular:     Rate and Rhythm: Normal rate and regular rhythm.  Pulmonary:     Effort: Pulmonary effort is normal. No respiratory distress.   Neurological:     Mental Status: She is alert and oriented to person, place, and time.   Psychiatric:        Mood and Affect: Mood normal.        Behavior: Behavior normal.        Assessment/Plan: 1. Dysuria (Primary) Urinalysis is improving  and only showed trace blood this time instead of moderate blood.  - POCT Urinalysis Dipstick  2. Abnormal finding on urinalysis Urine culture sent  - CULTURE, URINE COMPREHENSIVE  3. Encounter for screening examination for sexually transmitted disease Urine sent for STD testing.  - Chlamydia/Gonococcus/Trichomonas, NAA    General Counseling: Rebecca Woods verbalizes understanding of the findings of todays visit and agrees with plan of treatment. I have discussed any further diagnostic evaluation that may be needed or ordered today. We also reviewed her medications today. she has been encouraged to call the office with any questions or concerns that should arise related to todays visit.    Orders Placed This Encounter  Procedures   Chlamydia/Gonococcus/Trichomonas, NAA   CULTURE, URINE COMPREHENSIVE   POCT Urinalysis Dipstick    No orders of the defined types were placed in this encounter.   Return if symptoms worsen or fail to improve.   Total time spent:30 Minutes Time spent includes review of chart, medications, test results, and follow up plan with the patient.   [place] Controlled Substance Database was reviewed by me.  This patient was seen by Mardy Maxin, FNP-C in collaboration with Dr. Sigrid Bathe as a part of collaborative care agreement.   Harlene Petralia R. Maxin, MSN, FNP-C Internal medicine

## 2024-04-23 ENCOUNTER — Ambulatory Visit: Payer: Self-pay | Admitting: Physician Assistant

## 2024-04-23 NOTE — Telephone Encounter (Signed)
 Spoke with patient regarding lab results.

## 2024-04-23 NOTE — Telephone Encounter (Signed)
-----   Message from Tinnie MARLA Pro sent at 04/23/2024 12:57 PM EDT ----- Please let her know that her blood work shows that both her Vit D and her iron storage are low and should supplement both of these OTC. ----- Message ----- From: Liana Fish, NP Sent: 04/23/2024  12:28 PM EDT To: Tinnie MARLA Pro, PA-C  Here are her labs. I think I ordered them at a visit previously ----- Message ----- From: Interface, [facility] Lab Results In Sent: 03/31/2024   7:27 AM EDT To: Fish Liana, NP

## 2024-04-24 LAB — CULTURE, URINE COMPREHENSIVE

## 2024-04-24 LAB — CHLAMYDIA/GONOCOCCUS/TRICHOMONAS, NAA
Chlamydia by NAA: NEGATIVE
Gonococcus by NAA: NEGATIVE
Trich vag by NAA: NEGATIVE

## 2024-04-28 ENCOUNTER — Ambulatory Visit
Admission: EM | Admit: 2024-04-28 | Discharge: 2024-04-28 | Disposition: A | Discharge status: Home / Self Care | Attending: Physician Assistant | Admitting: Physician Assistant

## 2024-04-28 DIAGNOSIS — H00011 Hordeolum externum right upper eyelid: Secondary | ICD-10-CM | POA: Diagnosis not present

## 2024-04-28 MED ORDER — DOXYCYCLINE HYCLATE 100 MG PO CAPS: 100.0000 mg | ORAL_CAPSULE | Freq: Two times a day (BID) | ORAL | 0 refills | Status: AC

## 2024-04-28 NOTE — ED Provider Notes (Signed)
 [facility]    CSN: [id number] Arrival date & time: 04/28/24  1255      History   Chief Complaint Chief Complaint  Patient presents with   Eye Problem   Headache    HPI Rebecca Woods is a 28 y.o. female presenting for right large tender stye of the right upper eyelid for the past couple weeks.  Patient says it has recently gotten larger.  She has been applying warm compresses diligently but says it has not helped and it has actually worsened.  She denies any redness of the eye or drainage from the eye.  No vision changes.  She does report increase in her migraine headaches since having the stye.  HPI  Past Medical History:  Diagnosis Date   Acute vaginitis 09/26/2019   Anemia    H/O   Bacterial vaginitis 08/02/2019   Dyspareunia due to medical condition in female 05/24/2016   GERD (gastroesophageal reflux disease)    NO MEDS    Patient Active Problem List   Diagnosis Date Noted   Retained products of conception following abortion 12/16/2023   Unwanted fertility 10/29/2023   Umbilical hernia 03/23/2020    Past Surgical History:  Procedure Laterality Date   LAPAROSCOPY N/A 05/24/2016   Procedure: LAPAROSCOPY DIAGNOSTIC;  Surgeon: Lamar SHAUNNA Lesches, MD;  Location: [facility] ORS;  Service: Gynecology;  Laterality: N/A;   OVARIAN CYST SURGERY  2017    OB History     Gravida  2   Para  2   Term  1   Preterm  1   AB  0   Living  2      SAB  0   IAB  0   Ectopic  0   Multiple  0   Live Births  2            Home Medications    Prior to Admission medications   Medication Sig Start Date End Date Taking? Authorizing Provider  doxycycline  (VIBRAMYCIN ) 100 MG capsule Take 1 capsule (100 mg total) by mouth 2 (two) times daily for 7 days. 04/28/24 05/05/24 Yes Arvis Jolan NOVAK, PA-C  misoprostol  (CYTOTEC ) 200 MCG tablet Take 800 mcg by mouth once. 11/14/23   [provider]  mupirocin  ointment (BACTROBAN ) 2 % Apply 1 Application topically 2  (two) times daily. 01/29/24   Hyatt, Royden DASEN, DPM    Family History Family History  Problem Relation Age of Onset   Diabetes Mother    Cancer Paternal Grandfather        STOMACH OR COLON NOT SURE   Thyroid disease Neg Hx     Social History Social History   Tobacco Use   Smoking status: Never   Smokeless tobacco: Never  Vaping Use   Vaping status: Never Used  Substance Use Topics   Alcohol use: Yes    Comment: occasionally   Drug use: Not Currently    Types: Marijuana     Allergies   Patient has no known allergies.   Review of Systems Review of Systems  Constitutional:  Negative for fatigue and fever.  HENT:  Negative for congestion and facial swelling.   Eyes:  Positive for pain. Negative for photophobia, discharge, redness, itching and visual disturbance.  Respiratory:  Negative for cough.   Neurological:  Positive for headaches. Negative for dizziness.     Physical Exam Triage Vital Signs ED Triage Vitals  Encounter Vitals Group     BP 04/28/24 1305 113/77  Girls Systolic BP Percentile --      Girls Diastolic BP Percentile --      Boys Systolic BP Percentile --      Boys Diastolic BP Percentile --      Pulse Rate 04/28/24 1305 75     Resp 04/28/24 1305 16     Temp 04/28/24 1305 97.9 F (36.6 C)     Temp Source 04/28/24 1305 Oral     SpO2 04/28/24 1305 100 %     Weight 04/28/24 1304 190 lb (86.2 kg)     Height 04/28/24 1304 5' 7 (1.702 m)     Head Circumference --      Peak Flow --      Pain Score 04/28/24 1308 10     Pain Loc --      Pain Education --      Exclude from Growth Chart --    No data found.  Updated Vital Signs BP 113/77 (BP Location: Right Arm)   Pulse 75   Temp 97.9 F (36.6 C) (Oral)   Resp 16   Ht 5' 7 (1.702 m)   Wt 190 lb (86.2 kg)   LMP 04/11/2024 (Exact Date)   SpO2 100%   BMI 29.76 kg/m   Visual Acuity Right Eye Distance: 20/15 Left Eye Distance: 20/15 Bilateral Distance: 20/13 (w/o correction)   Physical  Exam Vitals and nursing note reviewed.  Constitutional:      General: She is not in acute distress.    Appearance: Normal appearance. She is not ill-appearing or toxic-appearing.  HENT:     Head: Normocephalic and atraumatic.     Nose: Nose normal.     Mouth/Throat:     Mouth: Mucous membranes are moist.     Pharynx: Oropharynx is clear.   Eyes:     General: No scleral icterus.       Right eye: Hordeolum (large right external upper eyelid) present. No discharge.        Left eye: No discharge.     Extraocular Movements: Extraocular movements intact.     Conjunctiva/sclera: Conjunctivae normal.     Pupils: Pupils are equal, round, and reactive to light.    Cardiovascular:     Rate and Rhythm: Normal rate.  Pulmonary:     Effort: Pulmonary effort is normal. No respiratory distress.   Musculoskeletal:     Cervical back: Neck supple.   Skin:    General: Skin is dry.   Neurological:     General: No focal deficit present.     Mental Status: She is alert. Mental status is at baseline.     Motor: No weakness.     Gait: Gait normal.   Psychiatric:        Mood and Affect: Mood normal.        Behavior: Behavior normal.      UC Treatments / Results  Labs (all labs ordered are listed, but only abnormal results are displayed) Labs Reviewed - No data to display  EKG   Radiology No results found.  Procedures Procedures (including critical care time)  Medications Ordered in UC Medications - No data to display  Initial Impression / Assessment and Plan / UC Course  I have reviewed the triage vital signs and the nursing notes.  Pertinent labs & imaging results that were available during my care of the patient were reviewed by me and considered in my medical decision making (see chart for details).   28 year old female presents  for 2-week history of stye of the right upper external eyelid and headaches.  No improvement in condition despite using warm compresses  diligently.  Given the patient's stye is quite large and has recently worsened and been present for couple weeks we will try an oral antibiotic.  Sent doxycycline  to pharmacy and encouraged continuing warm compresses.  Advised following up with PCP or directly with ophthalmology if not improving after the antibiotics or if symptoms acutely worsen.  If she develops a fever or significant worsening of symptoms she should go to the emergency department.   Final Clinical Impressions(s) / UC Diagnoses   Final diagnoses:  Hordeolum externum of right upper eyelid     Discharge Instructions      -Continue warm compresses. - May try the oral antibiotics to see if that will help.  If it does not you might need to follow-up with an eye specialist to see if the area needs to be drained.     ED Prescriptions     Medication Sig Dispense Auth. Provider   doxycycline  (VIBRAMYCIN ) 100 MG capsule Take 1 capsule (100 mg total) by mouth 2 (two) times daily for 7 days. 14 capsule Arvis Jolan NOVAK, PA-C      PDMP not reviewed this encounter.   Arvis Jolan NOVAK, PA-C 04/28/24 1354

## 2024-04-28 NOTE — Discharge Instructions (Addendum)
-  Continue warm compresses. - May try the oral antibiotics to see if that will help.  If it does not you might need to follow-up with an eye specialist to see if the area needs to be drained.

## 2024-04-28 NOTE — ED Triage Notes (Signed)
 Pt c/o stye on top of R eye lid & HA x2 wks. Has tried OTC meds w/o relief.

## 2024-04-29 ENCOUNTER — Ambulatory Visit: Payer: Self-pay

## 2024-10-01 ENCOUNTER — Ambulatory Visit (LOCAL_COMMUNITY_HEALTH_CENTER): Payer: Self-pay

## 2024-10-01 VITALS — BP 136/80 | Ht 66.0 in | Wt 194.0 lb

## 2024-10-01 DIAGNOSIS — Z3202 Encounter for pregnancy test, result negative: Secondary | ICD-10-CM

## 2024-10-01 DIAGNOSIS — Z309 Encounter for contraceptive management, unspecified: Secondary | ICD-10-CM

## 2024-10-01 LAB — PREGNANCY, URINE: Preg Test, Ur: NEGATIVE

## 2024-10-01 NOTE — Progress Notes (Signed)
 UPT negative. LMP 08/30/2024. Results discussed. Not planning a preg, but ok if it happens. No bc method. Sex each week. Has provider at [facility].   Reports clear and whitish d/c x 2 days with some cramping. Advised to have it evaluated through [facility] provider or [facility]. Also offered [facility] appt if desires bc method and/or screenings.  Advised to repeat preg test if no period this month.  Negative preg test packet given.  Questions answered and reports understanding. Latoi Giraldo, RN

## 2024-10-09 ENCOUNTER — Ambulatory Visit: Admitting: Physician Assistant

## 2024-10-09 ENCOUNTER — Encounter: Payer: Self-pay | Admitting: Physician Assistant

## 2024-10-09 VITALS — BP 140/80 | HR 78 | Temp 98.0°F | Resp 16 | Ht 67.0 in | Wt 192.0 lb

## 2024-10-09 DIAGNOSIS — R102 Pelvic and perineal pain unspecified side: Secondary | ICD-10-CM

## 2024-10-09 DIAGNOSIS — R051 Acute cough: Secondary | ICD-10-CM

## 2024-10-09 DIAGNOSIS — N926 Irregular menstruation, unspecified: Secondary | ICD-10-CM

## 2024-10-09 LAB — POCT URINE PREGNANCY: Preg Test, Ur: NEGATIVE

## 2024-10-09 MED ORDER — BENZONATATE 200 MG PO CAPS: 200.0000 mg | ORAL_CAPSULE | Freq: Two times a day (BID) | ORAL | 0 refills | Status: AC | PRN

## 2024-10-09 NOTE — Progress Notes (Unsigned)
 [facility] [address] [place], [place] [address]  Internal MEDICINE  Office Visit Note  Patient Name: Rebecca Woods  [id number]  [id number]  Date of Service: 10/09/2024  Chief Complaint  Patient presents with   Follow-up    HPI Pt is here for routine follow up -Stingling intermittently LLQ -last cycle Nov 2-7, but no cycle  -IUD removed Oct 14 after placed in July 31. States this made her eat a lot and had headaches. -cough at night, for about 2 weeks. No congestion SOB or wheezing  Current Medication: Outpatient Encounter Medications as of 10/09/2024  Medication Sig   misoprostol  (CYTOTEC ) 200 MCG tablet Take 800 mcg by mouth once.   mupirocin  ointment (BACTROBAN ) 2 % Apply 1 Application topically 2 (two) times daily.   No facility-administered encounter medications on file as of 10/09/2024.    Surgical History: Past Surgical History:  Procedure Laterality Date   DILATION AND CURETTAGE OF UTERUS  10/2023   LAPAROSCOPY N/A 05/24/2016   Procedure: LAPAROSCOPY DIAGNOSTIC;  Surgeon: Lamar SHAUNNA Lesches, MD;  Location: [facility] ORS;  Service: Gynecology;  Laterality: N/A;   OVARIAN CYST SURGERY  2017    Medical History: Past Medical History:  Diagnosis Date   Acute vaginitis 09/26/2019   Anemia    H/O   Bacterial vaginitis 08/02/2019   Dyspareunia due to medical condition in female 05/24/2016   GERD (gastroesophageal reflux disease)    NO MEDS   Hx of abortion    10/2023 and 04/2024    Family History: Family History  Problem Relation Age of Onset   Diabetes Mother    Cancer Paternal Grandfather        STOMACH OR COLON NOT SURE   Thyroid disease Neg Hx     Social History   Socioeconomic History   Marital status: Single    Spouse name: Not on file   Number of children: 1   Years of education: Not on file   Highest education level: High school graduate  Occupational History   Occupation: ADMIN    Comment: [employer]  Tobacco Use   Smoking status:  Never   Smokeless tobacco: Never  Vaping Use   Vaping status: Never Used  Substance and Sexual Activity   Alcohol use: Yes    Comment: last week 09/23/24 mimosa   Drug use: Not Currently    Types: Marijuana    Comment: last mj use -10/2023   Sexual activity: Yes    Birth control/protection: Pill, I.U.D., None    Comment: IUD removed after one month 05/2024  Other Topics Concern   Not on file  Social History Narrative   Not on file   Social Drivers of Health   Tobacco Use: Low Risk (10/09/2024)   Patient History    Smoking Tobacco Use: Never    Smokeless Tobacco Use: Never    Passive Exposure: Not on file  Financial Resource Strain: Not on file  Food Insecurity: Not on file  Transportation Needs: Not on file  Physical Activity: Not on file  Stress: Not on file  Social Connections: Not on file  Intimate Partner Violence: Not At Risk (10/01/2024)   Epic    Fear of Current or Ex-Partner: No    Emotionally Abused: No    Physically Abused: No    Sexually Abused: No  Depression (PHQ2-9): Low Risk (10/09/2024)   Depression (PHQ2-9)    PHQ-2 Score: 0  Alcohol Screen: Low Risk (10/09/2024)   Alcohol Screen  Last Alcohol Screening Score (AUDIT): 2  Housing: Not on file  Utilities: Not on file  Health Literacy: Not on file      Review of Systems  Vital Signs: BP (!) 140/80   Pulse 78   Temp 98 F (36.7 C)   Resp 16   Ht 5' 7 (1.702 m)   Wt 192 lb (87.1 kg)   LMP 08/30/2024 (Exact Date)   SpO2 97%   BMI 30.07 kg/m    Physical Exam     Assessment/Plan:   General Counseling: Alissa verbalizes understanding of the findings of todays visit and agrees with plan of treatment. I have discussed any further diagnostic evaluation that may be needed or ordered today. We also reviewed her medications today. she has been encouraged to call the office with any questions or concerns that should arise related to todays visit.    Orders Placed This Encounter   Procedures   POCT urine pregnancy    No orders of the defined types were placed in this encounter.   This patient was seen by Tinnie Pro, PA-C in collaboration with Dr. Sigrid Bathe as a part of collaborative care agreement.   Total time spent:*** Minutes Time spent includes review of chart, medications, test results, and follow up plan with the patient.      Dr Fozia M Khan Internal medicine

## 2024-10-20 ENCOUNTER — Other Ambulatory Visit

## 2024-11-06 ENCOUNTER — Ambulatory Visit: Admitting: Physician Assistant

## 2024-12-11 ENCOUNTER — Ambulatory Visit: Admitting: Physician Assistant
